# Patient Record
Sex: Female | Born: 1950 | Race: Black or African American | Hispanic: No | Smoking: Former smoker
Health system: Southern US, Community
[De-identification: ages and names within clinical notes are randomized; demographics above are authoritative.]

## PROBLEM LIST (undated history)

## (undated) DIAGNOSIS — N200 Calculus of kidney: Secondary | ICD-10-CM

## (undated) DIAGNOSIS — C801 Malignant (primary) neoplasm, unspecified: Secondary | ICD-10-CM

## (undated) DIAGNOSIS — Z87442 Personal history of urinary calculi: Secondary | ICD-10-CM

## (undated) DIAGNOSIS — E119 Type 2 diabetes mellitus without complications: Secondary | ICD-10-CM

## (undated) HISTORY — PX: EYE SURGERY: SHX253

## (undated) HISTORY — DX: Type 2 diabetes mellitus without complications: E11.9

## (undated) HISTORY — PX: CATARACT EXTRACTION: SUR2

## (undated) HISTORY — PX: ABDOMINAL HYSTERECTOMY: SHX81

## (undated) HISTORY — PX: OTHER SURGICAL HISTORY: SHX169

## (undated) HISTORY — PX: BREAST SURGERY: SHX581

## (undated) HISTORY — PX: US KOMAN RT BREAST (ARMC HX): HXRAD1174

## (undated) HISTORY — DX: Calculus of kidney: N20.0

---

## 2009-07-31 DIAGNOSIS — C7A092 Malignant carcinoid tumor of the stomach: Secondary | ICD-10-CM | POA: Insufficient documentation

## 2016-05-02 DIAGNOSIS — N200 Calculus of kidney: Secondary | ICD-10-CM | POA: Insufficient documentation

## 2016-05-02 DIAGNOSIS — K219 Gastro-esophageal reflux disease without esophagitis: Secondary | ICD-10-CM | POA: Insufficient documentation

## 2016-05-02 DIAGNOSIS — E785 Hyperlipidemia, unspecified: Secondary | ICD-10-CM | POA: Insufficient documentation

## 2016-05-02 DIAGNOSIS — C50919 Malignant neoplasm of unspecified site of unspecified female breast: Secondary | ICD-10-CM | POA: Insufficient documentation

## 2016-07-03 DIAGNOSIS — Z853 Personal history of malignant neoplasm of breast: Secondary | ICD-10-CM | POA: Insufficient documentation

## 2017-02-03 DIAGNOSIS — Z87898 Personal history of other specified conditions: Secondary | ICD-10-CM | POA: Insufficient documentation

## 2017-03-30 DIAGNOSIS — Z9010 Acquired absence of unspecified breast and nipple: Secondary | ICD-10-CM | POA: Insufficient documentation

## 2017-04-13 ENCOUNTER — Emergency Department
Admission: EM | Admit: 2017-04-13 | Discharge: 2017-04-13 | Disposition: A | Discharge status: Home / Self Care | Payer: Medicare Other | Attending: Emergency Medicine | Admitting: Emergency Medicine

## 2017-04-13 DIAGNOSIS — L253 Unspecified contact dermatitis due to other chemical products: Secondary | ICD-10-CM | POA: Insufficient documentation

## 2017-04-13 DIAGNOSIS — R21 Rash and other nonspecific skin eruption: Secondary | ICD-10-CM | POA: Diagnosis present

## 2017-04-13 MED ORDER — TRIAMCINOLONE ACETONIDE 0.5 % EX OINT: 1.0000 "application " | TOPICAL_OINTMENT | Freq: Two times a day (BID) | CUTANEOUS | 0 refills | Status: DC

## 2017-04-13 NOTE — ED Notes (Signed)
First Nurse Note: Pt ambulatory c/o allergic reaction. Pt in NAD.

## 2017-04-13 NOTE — ED Provider Notes (Signed)
[facility] Emergency Department Provider Note  ____________________________________________   I have reviewed the triage vital signs and the nursing notes.   HISTORY  Chief Complaint Rash    HPI Marcia Blake is a 66 y.o. female  Who presents today complaining of a rash.  Patient did have surgery, reconstructive, on her chest and she had incisions on her back in her chest wall.  She has been putting a cocoa butter cream preparation on this area for the last several weeks and as she has been doing that she has had a progression of rash exactly where she has been placing the cream.  She was seen by her surgeon a few days ago and told to stop using the cream which she did on Friday but the rash persists.  She is on steroids as a result of this as well.  She has been taking steroids for 2 days.  There is been no fever no chills no nausea no vomiting no anaphylactic symptoms no throat swelling no hives etc.  The rash is a burning discomfort in the exact area of where she was putting her cream.  She has not had a localized reaction like this before.  She denies any systemic symptoms.  She has had no recent radiation oncology therapy    No past medical history on file.  There are no active problems to display for this patient.     Prior to Admission medications   Not on File    Allergies Penicillins and Sulfur  No family history on file.  Social History Social History   Tobacco Use  . Smoking status: Not on file  Substance Use Topics  . Alcohol use: Not on file  . Drug use: Not on file    Review of Systems Constitutional: No fever/chills Eyes: No visual changes. ENT: No sore throat. No stiff neck no neck pain Cardiovascular: Denies chest pain. Respiratory: Denies shortness of breath. Gastrointestinal:   no vomiting.  No diarrhea.  No constipation. Genitourinary: Negative for dysuria. Musculoskeletal: Negative lower extremity swelling Skin:  Positive for rash. Neurological: Negative for severe headaches, focal weakness or numbness.   ____________________________________________   PHYSICAL EXAM:  VITAL SIGNS: ED Triage Vitals  Enc Vitals Group     BP 04/13/17 0754 136/68     Pulse Rate 04/13/17 0754 85     Resp 04/13/17 0754 18     Temp 04/13/17 0754 98 F (36.7 C)     Temp Source 04/13/17 0754 Oral     SpO2 04/13/17 0754 98 %     Weight 04/13/17 0754 182 lb (82.6 kg)     Height 04/13/17 0754 5\' 2"  (1.575 m)     Head Circumference --      Peak Flow --      Pain Score 04/13/17 0808 8     Pain Loc --      Pain Edu? --      Excl. in [place]? --     Constitutional: Alert and oriented. Well appearing and in no acute distress. Eyes: Conjunctivae are normal Head: Atraumatic HEENT: No congestion/rhinnorhea. Mucous membranes are moist.  Oropharynx non-erythematous Neck:   Nontender with no meningismus, no masses, no stridor Cardiovascular: Normal rate, regular rhythm. Grossly normal heart sounds.  Good peripheral circulation. Respiratory: Normal respiratory effort.  No retractions. Lungs CTAB. Abdominal: Soft and nontender. No distention. No guarding no rebound Back:  There is no focal tenderness or step off.  there is no midline tenderness  there are no lesions noted. there is no CVA tenderness Musculoskeletal: No lower extremity tenderness, no upper extremity tenderness. No joint effusions, no DVT signs strong distal pulses no edema Neurologic:  Normal speech and language. No gross focal neurologic deficits are appreciated.  Skin:  Skin is warm, dry and intact.  There is linear, rectangular rash over the scar tissue on her back bilaterally and exact distribution of where she has been placed in the cream and also along the suture layers in front on the chest wall.  Female nurse chaperone present.  Blanchable, non-tender, no satellite lesions, not hot to touch. Psychiatric: Mood and affect are normal. Speech and behavior are  normal.  ____________________________________________   LABS (all labs ordered are listed, but only abnormal results are displayed)  Labs Reviewed - No data to display  Pertinent labs  results that were available during my care of the patient were reviewed by me and considered in my medical decision making (see chart for details). ____________________________________________  EKG  I personally interpreted any EKGs ordered by me or triage  ____________________________________________  RADIOLOGY  Pertinent labs & imaging results that were available during my care of the patient were reviewed by me and considered in my medical decision making (see chart for details). If possible, patient and/or family made aware of any abnormal findings.  No results found. ____________________________________________    PROCEDURES  Procedure(s) performed: None  Procedures  Critical Care performed: None  ____________________________________________   INITIAL IMPRESSION / ASSESSMENT AND PLAN / ED COURSE  Pertinent labs & imaging results that were available during my care of the patient were reviewed by me and considered in my medical decision making (see chart for details).  She is here with symptoms consistent with a contact dermatitis exactly in the distribution of where she has been using cocoa butter cream.  We have advised her to stop using this.  Patient actually has not put it on there for a day and a half.  We will start her on topical steroids, I do not see any evidence of cellulitis or systemic anaphylaxis.  Patient very comfortable with this plan return precautions and follow-up given and understood.    ____________________________________________   FINAL CLINICAL IMPRESSION(S) / ED DIAGNOSES  Final diagnoses:  None      This chart was dictated using voice recognition software.  Despite best efforts to proofread,  errors can occur which can change meaning.       Schuyler Amor, MD 04/13/17 [phone]

## 2017-04-13 NOTE — ED Notes (Signed)
ED Provider at bedside. 

## 2017-04-13 NOTE — ED Triage Notes (Signed)
Pt presents via POV c/o rash to back and chest, possible allergic reaction per pt report. Pt currently taking prednisone outpatient for rash. Recent bilateral mastectomy. Pt denies fever. Seen Thursday by plastics.

## 2019-01-20 ENCOUNTER — Other Ambulatory Visit: Payer: Self-pay

## 2019-01-20 DIAGNOSIS — Z800 Family history of malignant neoplasm of digestive organs: Secondary | ICD-10-CM

## 2019-01-20 DIAGNOSIS — Z1211 Encounter for screening for malignant neoplasm of colon: Secondary | ICD-10-CM

## 2019-01-20 DIAGNOSIS — Z860100 Personal history of colon polyps, unspecified: Secondary | ICD-10-CM

## 2019-01-20 MED ORDER — PEG 3350-KCL-NA BICARB-NACL 420 G PO SOLR: 4000.0000 mL | Freq: Once | ORAL | 0 refills | Status: AC

## 2019-01-20 MED ORDER — NA SULFATE-K SULFATE-MG SULF 17.5-3.13-1.6 GM/177ML PO SOLN: 1.0000 | Freq: Once | ORAL | 0 refills | Status: DC

## 2019-02-01 ENCOUNTER — Other Ambulatory Visit: Payer: Self-pay

## 2019-02-01 ENCOUNTER — Other Ambulatory Visit
Admission: RE | Admit: 2019-02-01 | Discharge: 2019-02-01 | Disposition: A | Discharge status: Home / Self Care | Payer: Medicare Other | Source: Ambulatory Visit | Attending: Gastroenterology | Admitting: Gastroenterology

## 2019-02-01 DIAGNOSIS — Z20828 Contact with and (suspected) exposure to other viral communicable diseases: Secondary | ICD-10-CM | POA: Diagnosis not present

## 2019-02-01 DIAGNOSIS — K635 Polyp of colon: Secondary | ICD-10-CM | POA: Diagnosis not present

## 2019-02-01 DIAGNOSIS — K5790 Diverticulosis of intestine, part unspecified, without perforation or abscess without bleeding: Secondary | ICD-10-CM | POA: Diagnosis not present

## 2019-02-01 DIAGNOSIS — K649 Unspecified hemorrhoids: Secondary | ICD-10-CM | POA: Diagnosis not present

## 2019-02-01 DIAGNOSIS — Z01812 Encounter for preprocedural laboratory examination: Secondary | ICD-10-CM | POA: Insufficient documentation

## 2019-02-01 LAB — SARS CORONAVIRUS 2 (TAT 6-24 HRS): SARS Coronavirus 2: NEGATIVE

## 2019-02-03 ENCOUNTER — Encounter: Payer: Self-pay | Admitting: *Deleted

## 2019-02-04 ENCOUNTER — Ambulatory Visit: Payer: Medicare Other | Admitting: Certified Registered"

## 2019-02-04 ENCOUNTER — Ambulatory Visit
Admission: RE | Admit: 2019-02-04 | Discharge: 2019-02-04 | Disposition: A | Discharge status: Home / Self Care | Payer: Medicare Other | Attending: Gastroenterology | Admitting: Gastroenterology

## 2019-02-04 ENCOUNTER — Encounter: Payer: Self-pay | Admitting: *Deleted

## 2019-02-04 ENCOUNTER — Encounter
Admission: RE | Disposition: A | Discharge status: Home / Self Care | Payer: Self-pay | Source: Home / Self Care | Attending: Gastroenterology

## 2019-02-04 DIAGNOSIS — Z1211 Encounter for screening for malignant neoplasm of colon: Secondary | ICD-10-CM | POA: Diagnosis not present

## 2019-02-04 DIAGNOSIS — Z8371 Family history of colonic polyps: Secondary | ICD-10-CM | POA: Insufficient documentation

## 2019-02-04 DIAGNOSIS — K621 Rectal polyp: Secondary | ICD-10-CM

## 2019-02-04 DIAGNOSIS — Z87891 Personal history of nicotine dependence: Secondary | ICD-10-CM | POA: Insufficient documentation

## 2019-02-04 DIAGNOSIS — D122 Benign neoplasm of ascending colon: Secondary | ICD-10-CM | POA: Diagnosis not present

## 2019-02-04 DIAGNOSIS — D123 Benign neoplasm of transverse colon: Secondary | ICD-10-CM | POA: Insufficient documentation

## 2019-02-04 DIAGNOSIS — K635 Polyp of colon: Secondary | ICD-10-CM

## 2019-02-04 DIAGNOSIS — K5730 Diverticulosis of large intestine without perforation or abscess without bleeding: Secondary | ICD-10-CM

## 2019-02-04 DIAGNOSIS — K644 Residual hemorrhoidal skin tags: Secondary | ICD-10-CM

## 2019-02-04 DIAGNOSIS — Z86010 Personal history of colonic polyps: Secondary | ICD-10-CM

## 2019-02-04 DIAGNOSIS — Z800 Family history of malignant neoplasm of digestive organs: Secondary | ICD-10-CM

## 2019-02-04 DIAGNOSIS — Z853 Personal history of malignant neoplasm of breast: Secondary | ICD-10-CM | POA: Insufficient documentation

## 2019-02-04 HISTORY — PX: COLONOSCOPY WITH PROPOFOL: SHX5780

## 2019-02-04 HISTORY — DX: Malignant (primary) neoplasm, unspecified: C80.1

## 2019-02-04 SURGERY — COLONOSCOPY WITH PROPOFOL
Anesthesia: General

## 2019-02-04 MED ORDER — PHENYLEPHRINE HCL (PRESSORS) 10 MG/ML IV SOLN
INTRAVENOUS | Status: AC
  Filled 2019-02-04: qty 1

## 2019-02-04 MED ORDER — LIDOCAINE HCL (PF) 2 % IJ SOLN
INTRAMUSCULAR | Status: AC
  Filled 2019-02-04: qty 10

## 2019-02-04 MED ORDER — SODIUM CHLORIDE 0.9 % IV SOLN
INTRAVENOUS | Status: DC
  Administered 2019-02-04: 1000 mL via INTRAVENOUS

## 2019-02-04 MED ORDER — PROPOFOL 500 MG/50ML IV EMUL
INTRAVENOUS | Status: AC
  Filled 2019-02-04: qty 50

## 2019-02-04 MED ORDER — PROPOFOL 10 MG/ML IV BOLUS
INTRAVENOUS | Status: DC | PRN
  Administered 2019-02-04: 50 mg via INTRAVENOUS
  Administered 2019-02-04 (×2): 20 mg via INTRAVENOUS

## 2019-02-04 MED ORDER — PROPOFOL 500 MG/50ML IV EMUL
INTRAVENOUS | Status: DC | PRN
  Administered 2019-02-04: 100 ug/kg/min via INTRAVENOUS

## 2019-02-04 MED ORDER — LIDOCAINE HCL (CARDIAC) PF 100 MG/5ML IV SOSY
PREFILLED_SYRINGE | INTRAVENOUS | Status: DC | PRN
  Administered 2019-02-04: 60 mg via INTRAVENOUS

## 2019-02-04 NOTE — Anesthesia Preprocedure Evaluation (Addendum)
Anesthesia Evaluation  Patient identified by MRN, date of birth, ID band Patient awake    Reviewed: Allergy & Precautions, H&P , NPO status , Patient's Chart, lab work & pertinent test results  Airway Mallampati: II  TM Distance: >3 FB     Dental  (+) Teeth Intact   Pulmonary neg shortness of breath, neg sleep apnea, neg COPD, neg recent URI, former smoker,           Cardiovascular (-) angina(-) Past MI negative cardio ROS  (-) dysrhythmias      Neuro/Psych negative neurological ROS  negative psych ROS   GI/Hepatic negative GI ROS, Neg liver ROS,   Endo/Other  negative endocrine ROS  Renal/GU negative Renal ROS  negative genitourinary   Musculoskeletal   Abdominal   Peds  Hematology negative hematology ROS (+)   Anesthesia Other Findings Past Medical History: [id number]: 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  Past Surgical History: No date: bilateral mastectomies; Bilateral No date: BREAST SURGERY No date: [place] KOMAN RT BREAST ([place] HX); Right  BMI    Body Mass Index: 32.76 kg/m      Reproductive/Obstetrics negative OB ROS                            Anesthesia Physical Anesthesia Plan  ASA: II  Anesthesia Plan: General   Post-op Pain Management:    Induction:   PONV Risk Score and Plan: Propofol infusion and TIVA  Airway Management Planned: Natural Airway and Nasal Cannula  Additional Equipment:   Intra-op Plan:   Post-operative Plan:   Informed Consent: I have reviewed the patients History and Physical, chart, labs and discussed the procedure including the risks, benefits and alternatives for the proposed anesthesia with the patient or authorized representative who has indicated his/her understanding and acceptance.     Dental Advisory Given  Plan Discussed with: Anesthesiologist and CRNA  Anesthesia Plan Comments:         Anesthesia Quick Evaluation

## 2019-02-04 NOTE — Op Note (Signed)
[facility] Gastroenterology Patient Name: Marcia Blake Procedure Date: 02/04/2019 8:26 AM MRN: [id number] Account #: [id number] Date of Birth: [birth year] Admit Type: Outpatient Age: 68 Room: [facility] ENDO ROOM 2 Gender: Female Note Status: Finalized Procedure:            Colonoscopy Indications:          Screening for colorectal malignant neoplasm Providers:            Lin Landsman MD, MD Referring MD:         Lin Landsman, Md Medicines:            Monitored Anesthesia Care Complications:        No immediate complications. Estimated blood loss: None. Procedure:            Pre-Anesthesia Assessment:                       - Prior to the procedure, a History and Physical was                        performed, and patient medications and allergies were                        reviewed. The patient is competent. The risks and                        benefits of the procedure and the sedation options and                        risks were discussed with the patient. All questions                        were answered and informed consent was obtained.                        Patient identification and proposed procedure were                        verified by the physician, the nurse, the                        anesthesiologist, the anesthetist and the technician in                        the pre-procedure area in the procedure room in the                        endoscopy suite. Mental Status Examination: alert and                        oriented. Airway Examination: normal oropharyngeal                        airway and neck mobility. Respiratory Examination:                        clear to auscultation. CV Examination: normal.                        Prophylactic Antibiotics: The patient does not require  prophylactic antibiotics. Prior Anticoagulants: The                        patient has taken no previous anticoagulant or   antiplatelet agents. ASA Grade Assessment: III - A                        patient with severe systemic disease. After reviewing                        the risks and benefits, the patient was deemed in                        satisfactory condition to undergo the procedure. The                        anesthesia plan was to use monitored anesthesia care                        (MAC). Immediately prior to administration of                        medications, the patient was re-assessed for adequacy                        to receive sedatives. The heart rate, respiratory rate,                        oxygen saturations, blood pressure, adequacy of                        pulmonary ventilation, and response to care were                        monitored throughout the procedure. The physical status                        of the patient was re-assessed after the procedure.                       After obtaining informed consent, the colonoscope was                        passed under direct vision. Throughout the procedure,                        the patient's blood pressure, pulse, and oxygen                        saturations were monitored continuously. The                        Colonoscope was introduced through the anus and                        advanced to the the cecum, identified by appendiceal                        orifice and ileocecal valve. The colonoscopy was  somewhat difficult due to multiple diverticula in the                        colon. Successful completion of the procedure was aided                        by applying abdominal pressure. The patient tolerated                        the procedure well. The quality of the bowel                        preparation was evaluated using the BBPS [facility] Bowel                        Preparation Scale) with scores of: Right Colon = 3,                        Transverse Colon = 3 and Left Colon = 3 (entire mucosa                         seen well with no residual staining, small fragments of                        stool or opaque liquid). The total BBPS score equals 9. Findings:      The perianal and digital rectal examinations were normal. Pertinent       negatives include normal sphincter tone and no palpable rectal lesions.      Two sessile polyps were found in the transverse colon and ascending       colon. The polyps were 3 mm in size. These polyps were removed with a       cold snare. Resection and retrieval were complete.      Two sessile polyps were found in the rectum. The polyps were diminutive       in size. These polyps were removed with a cold biopsy forceps. Resection       and retrieval were complete.      Scattered diverticula were found in the entire colon. There was no       evidence of diverticular bleeding.      Non-bleeding external hemorrhoids were found during retroflexion. The       hemorrhoids were large. Impression:           - Two 3 mm polyps in the transverse colon and in the                        ascending colon, removed with a cold snare. Resected                        and retrieved.                       - Two diminutive polyps in the rectum, removed with a                        cold biopsy forceps. Resected and retrieved.                       - Severe  diverticulosis in the entire examined colon.                        There was no evidence of diverticular bleeding.                       - Non-bleeding external hemorrhoids. Recommendation:       - Discharge patient to home (with escort).                       - Resume previous diet today.                       - Continue present medications.                       - Await pathology results.                       - Repeat colonoscopy in 7-10 years for surveillance                        based on pathology results. Procedure Code(s):    --- Professional ---                       [phone], Colonoscopy, flexible; with removal  of tumor(s),                        polyp(s), or other lesion(s) by snare technique                       45380, 31, Colonoscopy, flexible; with biopsy, single                        or multiple Diagnosis Code(s):    --- Professional ---                       Z12.11, Encounter for screening for malignant neoplasm                        of colon                       K63.5, Polyp of colon                       K64.4, Residual hemorrhoidal skin tags                       K62.1, Rectal polyp                       K57.30, Diverticulosis of large intestine without                        perforation or abscess without bleeding CPT copyright 2019 American Medical Association. All rights reserved. The codes documented in this report are preliminary and upon coder review may  be revised to meet current compliance requirements. Dr. Ulyess Mort Lin Landsman MD, MD 02/04/2019 9:18:18 AM This report has been signed electronically. Number of Addenda: 0 Note Initiated On: 02/04/2019 8:26 AM Scope Withdrawal Time: 0 hours 14 minutes 1 second  Total Procedure Duration:  0 hours 19 minutes 55 seconds  Estimated Blood Loss: Estimated blood loss: none.      [facility]

## 2019-02-04 NOTE — Transfer of Care (Signed)
Immediate Anesthesia Transfer of Care Note  Patient: Marcia Blake  Procedure(s) Performed: COLONOSCOPY WITH PROPOFOL (N/A )  Patient Location: PACU and Endoscopy Unit  Anesthesia Type:General  Level of Consciousness: sedated  Airway & Oxygen Therapy: Patient Spontanous Breathing  Post-op Assessment: Report given to RN and Post -op Vital signs reviewed and stable  Post vital signs: Reviewed and stable  Last Vitals:  Vitals Value Taken Time  BP 128/59 02/04/19 0920  Temp 36.5 C 02/04/19 0920  Pulse 82 02/04/19 0920  Resp 12 02/04/19 0920  SpO2 100 % 02/04/19 0920  Vitals shown include unvalidated device data.  Last Pain:  Vitals:   02/04/19 0920  TempSrc: Tympanic  PainSc:          Complications: No apparent anesthesia complications

## 2019-02-04 NOTE — Anesthesia Post-op Follow-up Note (Signed)
Anesthesia QCDR form completed.        

## 2019-02-04 NOTE — H&P (Signed)
Cephas Darby, MD [address]  [place]  [place], [place] [address]  Main: [phone]  Fax: [phone] Pager: [phone]  Primary Care Physician:  Lin Landsman, MD Primary Gastroenterologist:  Dr. Cephas Darby  Pre-Procedure History & Physical: HPI:  Marcia Blake is a 68 y.o. female is here for an colonoscopy.   Past Medical History:  Diagnosis Date  . Cancer ([place]) [id number]   breast-rt.    Past Surgical History:  Procedure Laterality Date  . bilateral mastectomies Bilateral   . BREAST SURGERY    . [place] KOMAN RT BREAST ([place] HX) Right     Prior to Admission medications   Medication Sig Start Date End Date Taking? Authorizing Provider  ascorbic acid (VITAMIN C) 1000 MG tablet Take 1,000 mg by mouth daily.   Yes [provider]  atorvastatin (LIPITOR) 20 MG tablet Take 20 mg by mouth daily.   Yes [provider]  Biotin 5000 MCG TABS Take 5,000 mg by mouth.   Yes [provider]  calcium-vitamin D (OSCAL WITH D) 500-200 MG-UNIT tablet Take 1 tablet by mouth.   Yes [provider]  co-enzyme Q-10 50 MG capsule Take 100 mg by mouth daily.   Yes [provider]  multivitamin-iron-minerals-folic acid (CENTRUM) chewable tablet Chew 1 tablet by mouth daily.   Yes [provider]  triamcinolone ointment (KENALOG) 0.5 % Apply 1 application topically 2 (two) times daily. 04/13/17  Yes Schuyler Amor, MD  ranitidine (ZANTAC) 150 MG capsule Take 150 mg by mouth 2 (two) times daily.    [provider]    Allergies as of 01/20/2019 - Review Complete 04/13/2017  Allergen Reaction Noted  . Penicillins Rash 04/13/2017  . Sulfur Rash 04/13/2017    History reviewed. No pertinent family history.  Social History   Socioeconomic History  . Marital status: Divorced    Spouse name: Not on file  . Number of children: Not on file  . Years of education: Not on file  . Highest education level: Not on file   Occupational History  . Not on file  Social Needs  . Financial resource strain: Not on file  . Food insecurity    Worry: Not on file    Inability: Not on file  . Transportation needs    Medical: Not on file    Non-medical: Not on file  Tobacco Use  . Smoking status: Former Smoker    Types: Cigarettes    Quit date: 1970    Years since quitting: 50.7  . Smokeless tobacco: Never Used  Substance and Sexual Activity  . Alcohol use: Yes    Comment: sociatly  . Drug use: Never  . Sexual activity: Not on file  Lifestyle  . Physical activity    Days per week: Not on file    Minutes per session: Not on file  . Stress: Not on file  Relationships  . Social Herbalist on phone: Not on file    Gets together: Not on file    Attends religious service: Not on file    Active member of club or organization: Not on file    Attends meetings of clubs or organizations: Not on file    Relationship status: Not on file  . Intimate partner violence    Fear of current or ex partner: Not on file    Emotionally abused: Not on file    Physically abused: Not on file    Forced  sexual activity: Not on file  Other Topics Concern  . Not on file  Social History Narrative  . Not on file    Review of Systems: See HPI, otherwise negative ROS  Physical Exam: BP (!) 148/76   Pulse 94   Temp (!) 96.9 F (36.1 C) (Tympanic)   Resp 20   Ht 5' 2.5" (1.588 m)   Wt 82.6 kg   SpO2 100%   BMI 32.76 kg/m  General:   Alert,  pleasant and cooperative in NAD Head:  Normocephalic and atraumatic. Neck:  Supple; no masses or thyromegaly. Lungs:  Clear throughout to auscultation.    Heart:  Regular rate and rhythm. Abdomen:  Soft, nontender and nondistended. Normal bowel sounds, without guarding, and without rebound.   Neurologic:  Alert and  oriented x4;  grossly normal neurologically.  Impression/Plan: Marcia Blake is here for an colonoscopy to be performed for colon cancer screening   Risks, benefits, limitations, and alternatives regarding  colonoscopy have been reviewed with the patient.  Questions have been answered.  All parties agreeable.   Sherri Sear, MD  02/04/2019, 8:39 AM

## 2019-02-05 LAB — SURGICAL PATHOLOGY

## 2019-02-05 NOTE — Anesthesia Postprocedure Evaluation (Signed)
Anesthesia Post Note  Patient: Marcia Blake  Procedure(s) Performed: COLONOSCOPY WITH PROPOFOL (N/A )  Patient location during evaluation: PACU Anesthesia Type: General Level of consciousness: awake and alert Pain management: pain level controlled Vital Signs Assessment: post-procedure vital signs reviewed and stable Respiratory status: spontaneous breathing, nonlabored ventilation and respiratory function stable Cardiovascular status: blood pressure returned to baseline and stable Postop Assessment: no apparent nausea or vomiting Anesthetic complications: no     Last Vitals:  Vitals:   02/04/19 0930 02/04/19 0940  BP: 139/79 (!) 150/79  Pulse: 77 79  Resp: 18 15  Temp:    SpO2: 100% 100%    Last Pain:  Vitals:   02/05/19 0738  TempSrc:   PainSc: 0-No pain                 Durenda Hurt

## 2019-07-15 ENCOUNTER — Ambulatory Visit: Payer: Medicare Other

## 2019-07-15 ENCOUNTER — Ambulatory Visit: Payer: Medicare Other | Attending: Internal Medicine

## 2019-07-15 DIAGNOSIS — Z23 Encounter for immunization: Secondary | ICD-10-CM | POA: Insufficient documentation

## 2019-07-15 NOTE — Progress Notes (Signed)
   Covid-19 Vaccination Clinic  Name:  Intisar Landin    MRN: [id number] DOB: 06/03/1950  07/15/2019  Ms. Moree was observed post Covid-19 immunization for 30 minutes based on pre-vaccination screening without incident. She was provided with Vaccine Information Sheet and instruction to access the V-Safe system.   Ms. Doelling was instructed to call 911 with any severe reactions post vaccine: Marland Kitchen Difficulty breathing  . Swelling of face and throat  . A fast heartbeat  . A bad rash all over body  . Dizziness and weakness   Immunizations Administered    Name Date Dose VIS Date Route   Pfizer COVID-19 Vaccine 07/15/2019 10:12 AM 0.3 mL 04/23/2019 Intramuscular   Manufacturer: [place]   Lot: [id number]   [place]: [id number]

## 2019-08-05 ENCOUNTER — Ambulatory Visit: Payer: Medicare Other | Attending: Internal Medicine

## 2019-08-05 DIAGNOSIS — Z23 Encounter for immunization: Secondary | ICD-10-CM

## 2019-08-05 NOTE — Progress Notes (Signed)
   Covid-19 Vaccination Clinic  Name:  Marcia Blake    MRN: [id number] DOB: 18-Jun-1950  08/05/2019  Ms. Rubens was observed post Covid-19 immunization for 15 minutes without incident. She was provided with Vaccine Information Sheet and instruction to access the V-Safe system.   Ms. Mercy was instructed to call 911 with any severe reactions post vaccine: Marland Kitchen Difficulty breathing  . Swelling of face and throat  . A fast heartbeat  . A bad rash all over body  . Dizziness and weakness   Immunizations Administered    Name Date Dose VIS Date Route   Pfizer COVID-19 Vaccine 08/05/2019  9:59 AM 0.3 mL 04/23/2019 Intramuscular   Manufacturer: [employer], [employer]   Lot: [id number]   [place]: [id number]

## 2019-08-25 ENCOUNTER — Other Ambulatory Visit: Payer: Self-pay

## 2019-08-25 ENCOUNTER — Ambulatory Visit: Payer: Medicare Other | Admitting: Dermatology

## 2019-08-25 DIAGNOSIS — L821 Other seborrheic keratosis: Secondary | ICD-10-CM

## 2019-08-25 DIAGNOSIS — L578 Other skin changes due to chronic exposure to nonionizing radiation: Secondary | ICD-10-CM

## 2019-08-25 DIAGNOSIS — D239 Other benign neoplasm of skin, unspecified: Secondary | ICD-10-CM

## 2019-08-25 DIAGNOSIS — D2362 Other benign neoplasm of skin of left upper limb, including shoulder: Secondary | ICD-10-CM

## 2019-08-25 DIAGNOSIS — Z1283 Encounter for screening for malignant neoplasm of skin: Secondary | ICD-10-CM | POA: Diagnosis not present

## 2019-08-25 DIAGNOSIS — L649 Androgenic alopecia, unspecified: Secondary | ICD-10-CM

## 2019-08-25 DIAGNOSIS — D2272 Melanocytic nevi of left lower limb, including hip: Secondary | ICD-10-CM

## 2019-08-25 DIAGNOSIS — B078 Other viral warts: Secondary | ICD-10-CM

## 2019-08-25 DIAGNOSIS — L219 Seborrheic dermatitis, unspecified: Secondary | ICD-10-CM

## 2019-08-25 DIAGNOSIS — D2372 Other benign neoplasm of skin of left lower limb, including hip: Secondary | ICD-10-CM

## 2019-08-25 DIAGNOSIS — D229 Melanocytic nevi, unspecified: Secondary | ICD-10-CM

## 2019-08-25 DIAGNOSIS — I8310 Varicose veins of unspecified lower extremity with inflammation: Secondary | ICD-10-CM

## 2019-08-25 MED ORDER — FLUOCINONIDE 0.05 % EX SOLN: CUTANEOUS | 3 refills | Status: DC

## 2019-08-25 MED ORDER — KETOCONAZOLE 2 % EX SHAM: MEDICATED_SHAMPOO | CUTANEOUS | 11 refills | Status: DC

## 2019-08-25 NOTE — Progress Notes (Signed)
   Follow-Up Visit   Subjective  Marcia Blake is a 69 y.o. female who presents for the following: Annual Exam.  Spot on left neck that patient noticed over 1 year ago. May be getting larger and gets in the way. Spot near right eye that has been present for years but would like to get it checked. Non-symptomatic.  Patient was diagnosed with scarring alopecia years ago. Would like to be evaluated today.   The following portions of the chart were reviewed this encounter and updated as appropriate: Tobacco  Allergies  Meds  Problems  Med Hx  Surg Hx  Fam Hx      Review of Systems: No other skin or systemic complaints.  Objective  Well appearing patient in no apparent distress; mood and affect are within normal limits.  A full examination was performed including scalp, head, eyes, ears, nose, lips, neck, chest, axillae, abdomen, back, buttocks, bilateral upper extremities, bilateral lower extremities, hands, feet, fingers, toes, fingernails, and toenails. All findings within normal limits unless otherwise noted below.  Objective  Scalp: Mild thinning, age related.  Objective  Left calf, left post shoulder: Firm pink/brown papulenodule with dimple sign.   Objective  Left shin: 1.0 x 0.6cm brown thin papule  Objective  Left Neck: Verrucous papule  Objective  Scalp, ears: Pink patches with greasy scale.   Assessment & Plan    Skin cancer screening performed today.  Seborrheic Keratoses - Stuck-on, waxy, tan-brown papules and plaques  - Discussed benign etiology and prognosis. - Observe - Call for any changes  Melanocytic Nevi - Tan-brown and/or pink-flesh-colored symmetric macules and papules - Benign appearing on exam today - Observation - Call clinic for new or changing moles - Recommend daily use of broad spectrum spf 30+ sunscreen to sun-exposed areas.   Varicose Veins - Dilated blue, purple or red veins at the lower extremities - Reassured - These  can be treated by sclerotherapy (a procedure to inject a medicine into the veins to make them disappear) if desired, but the treatment is not covered by insurance  Actinic Damage - diffuse scaly erythematous macules with underlying dyspigmentation - Recommend daily broad spectrum sunscreen SPF 30+ to sun-exposed areas, reapply every 2 hours as needed.  - Call for new or changing lesions.  Androgenetic alopecia Scalp  Recommend Rogaine   Dermatofibroma Left calf, left post shoulder  Benign, observe.    Nevus Left shin  Benign-appearing.  Observation.  Call clinic for new or changing moles.  Recommend daily use of broad spectrum spf 30+ sunscreen to sun-exposed areas.     Other viral warts Left Neck  Patient defers treatment in office today.  Recommend over the counter salicylic acid qhs   Seborrheic dermatitis Scalp, ears  Start fluocinonide solution daily as needed to scalp and ears.  Start ketoconazole 2% shampoo 1-3x/wk, leave on for 10 minutes before washing out.  fluocinonide (LIDEX) 0.05 % external solution - Scalp, ears  ketoconazole (NIZORAL) 2 % shampoo - Scalp, ears  Return if symptoms worsen or fail to improve.   Graciella Belton, RMA, am acting as scribe for Forest Gleason, MD .  Documentation: I have reviewed the above documentation for accuracy and completeness, and I agree with the above.  Forest Gleason, MD

## 2019-08-25 NOTE — Patient Instructions (Signed)
Recommend daily broad spectrum sunscreen SPF 30+ to sun-exposed areas, reapply every 2 hours as needed. Call for new or changing lesions.  Recommend over the counter salicylic acid (pads, lotion) to area on left neck.

## 2019-09-06 ENCOUNTER — Encounter: Payer: Self-pay | Admitting: Dermatology

## 2019-09-21 ENCOUNTER — Other Ambulatory Visit: Payer: Self-pay | Admitting: Infectious Diseases

## 2019-09-21 DIAGNOSIS — I10 Essential (primary) hypertension: Secondary | ICD-10-CM

## 2019-09-21 DIAGNOSIS — R42 Dizziness and giddiness: Secondary | ICD-10-CM

## 2019-10-01 ENCOUNTER — Other Ambulatory Visit: Payer: Self-pay

## 2019-10-01 ENCOUNTER — Ambulatory Visit
Admission: RE | Admit: 2019-10-01 | Discharge: 2019-10-01 | Disposition: A | Discharge status: Home / Self Care | Payer: Medicare Other | Source: Ambulatory Visit | Attending: Infectious Diseases | Admitting: Infectious Diseases

## 2019-10-01 DIAGNOSIS — R42 Dizziness and giddiness: Secondary | ICD-10-CM | POA: Insufficient documentation

## 2019-10-01 DIAGNOSIS — I10 Essential (primary) hypertension: Secondary | ICD-10-CM | POA: Insufficient documentation

## 2019-10-14 DIAGNOSIS — D3A010 Benign carcinoid tumor of the duodenum: Secondary | ICD-10-CM | POA: Insufficient documentation

## 2019-10-14 DIAGNOSIS — R42 Dizziness and giddiness: Secondary | ICD-10-CM | POA: Insufficient documentation

## 2019-10-14 DIAGNOSIS — M5412 Radiculopathy, cervical region: Secondary | ICD-10-CM | POA: Insufficient documentation

## 2019-10-15 DIAGNOSIS — Z86010 Personal history of colonic polyps: Secondary | ICD-10-CM | POA: Insufficient documentation

## 2019-10-15 DIAGNOSIS — R198 Other specified symptoms and signs involving the digestive system and abdomen: Secondary | ICD-10-CM | POA: Insufficient documentation

## 2019-10-15 DIAGNOSIS — R1013 Epigastric pain: Secondary | ICD-10-CM | POA: Insufficient documentation

## 2019-10-15 DIAGNOSIS — E739 Lactose intolerance, unspecified: Secondary | ICD-10-CM | POA: Insufficient documentation

## 2019-10-15 DIAGNOSIS — Z8619 Personal history of other infectious and parasitic diseases: Secondary | ICD-10-CM | POA: Insufficient documentation

## 2019-11-01 ENCOUNTER — Inpatient Hospital Stay: Payer: Medicare Other | Attending: Oncology | Admitting: Oncology

## 2019-11-01 ENCOUNTER — Inpatient Hospital Stay: Payer: Medicare Other

## 2019-11-01 ENCOUNTER — Other Ambulatory Visit: Payer: Self-pay

## 2019-11-01 ENCOUNTER — Encounter: Payer: Self-pay | Admitting: Oncology

## 2019-11-01 VITALS — BP 126/77 | HR 94 | Temp 98.8°F | Wt 175.0 lb

## 2019-11-01 DIAGNOSIS — Z9013 Acquired absence of bilateral breasts and nipples: Secondary | ICD-10-CM | POA: Diagnosis not present

## 2019-11-01 DIAGNOSIS — Z853 Personal history of malignant neoplasm of breast: Secondary | ICD-10-CM

## 2019-11-01 DIAGNOSIS — Z85020 Personal history of malignant carcinoid tumor of stomach: Secondary | ICD-10-CM | POA: Diagnosis not present

## 2019-11-01 DIAGNOSIS — Z08 Encounter for follow-up examination after completed treatment for malignant neoplasm: Secondary | ICD-10-CM

## 2019-11-01 DIAGNOSIS — C7A00 Malignant carcinoid tumor of unspecified site: Secondary | ICD-10-CM

## 2019-11-01 DIAGNOSIS — Z87891 Personal history of nicotine dependence: Secondary | ICD-10-CM | POA: Diagnosis not present

## 2019-11-01 DIAGNOSIS — Z79899 Other long term (current) drug therapy: Secondary | ICD-10-CM | POA: Insufficient documentation

## 2019-11-01 DIAGNOSIS — Z170 Estrogen receptor positive status [ER+]: Secondary | ICD-10-CM | POA: Insufficient documentation

## 2019-11-01 LAB — COMPREHENSIVE METABOLIC PANEL
ALT: 23 U/L (ref 0–44)
AST: 19 U/L (ref 15–41)
Albumin: 4.1 g/dL (ref 3.5–5.0)
Alkaline Phosphatase: 77 U/L (ref 38–126)
Anion gap: 10 (ref 5–15)
BUN: 17 mg/dL (ref 8–23)
CO2: 30 mmol/L (ref 22–32)
Calcium: 9.8 mg/dL (ref 8.9–10.3)
Chloride: 99 mmol/L (ref 98–111)
Creatinine, Ser: 0.8 mg/dL (ref 0.44–1.00)
GFR calc Af Amer: >60 mL/min (ref >60)
GFR calc non Af Amer: >60 mL/min (ref >60)
Glucose, Bld: 89 mg/dL (ref 70–99)
Potassium: 4.2 mmol/L (ref 3.5–5.1)
Sodium: 139 mmol/L (ref 135–145)
Total Bilirubin: 0.6 mg/dL (ref 0.3–1.2)
Total Protein: 7.7 g/dL (ref 6.5–8.1)

## 2019-11-01 LAB — CBC WITH DIFFERENTIAL/PLATELET
Abs Immature Granulocytes: 0.07 10*3/uL (ref 0.00–0.07)
Basophils Absolute: 0 10*3/uL (ref 0.0–0.1)
Basophils Relative: 0 %
Eosinophils Absolute: 0.1 10*3/uL (ref 0.0–0.5)
Eosinophils Relative: 2 %
HCT: 38.9 % (ref 36.0–46.0)
Hemoglobin: 12.7 g/dL (ref 12.0–15.0)
Immature Granulocytes: 1 %
Lymphocytes Relative: 31 %
Lymphs Abs: 2.2 10*3/uL (ref 0.7–4.0)
MCH: 29.6 pg (ref 26.0–34.0)
MCHC: 32.6 g/dL (ref 30.0–36.0)
MCV: 90.7 fL (ref 80.0–100.0)
Monocytes Absolute: 0.6 10*3/uL (ref 0.1–1.0)
Monocytes Relative: 8 %
Neutro Abs: 4.1 10*3/uL (ref 1.7–7.7)
Neutrophils Relative %: 58 %
Platelets: 251 10*3/uL (ref 150–400)
RBC: 4.29 MIL/uL (ref 3.87–5.11)
RDW: 12.3 % (ref 11.5–15.5)
WBC: 7.1 10*3/uL (ref 4.0–10.5)
nRBC: 0 % (ref 0.0–0.2)

## 2019-11-02 LAB — CHROMOGRANIN A: Chromogranin A (ng/mL): 133.4 ng/mL — ABNORMAL HIGH (ref 0.0–101.8)

## 2019-11-04 NOTE — Progress Notes (Signed)
Hematology/Oncology Consult note [facility] Telephone:([phone] [phone]  Patient Care Team: Leonel Ramsay, MD as PCP - General (Infectious Diseases)   Name of the patient: Marcia Blake  [id number]  [birth year]    Reason for referral-new diagnosis of breast cancer   Referring physician-Dr. Ola Spurr  Date of visit: 11/04/19   History of presenting illness- Patient is a 69 year old female with a past medical history significant for stage I right breast cancer back in 1996.  This was followed by a right breast lumpectomy 6 weeks of radiation treatment and 5 years of tamoxifen.  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  She underwent bilateral mastectomy at that time.  Left breast mastectomy showed sclerosing fibroadenoma with calcifications 0.8 cm completely excised.  No evidence of malignancy.  Right breast mastectomy showed no residual lesion pTis NX.  She did not require adjuvant hormone therapy after bilateral mastectomy.  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  She has been getting chromogranin A levels monitored which have been chronically elevated at 123 and 151 in the past.  Colonoscopy was negative and endoscopy report showed mild inflammatory changes at anastomosis which was widely patent and performed back in 2015.  Patient is currently feeling well and denies any complaints at this time  ECOG PS- 1  Pain scale- 0   Review of systems- Review of Systems  Constitutional: Positive for malaise/fatigue. Negative for chills, fever and weight loss.  HENT: Negative for congestion, ear discharge and nosebleeds.   Eyes: Negative for blurred vision.  Respiratory: Negative for cough, hemoptysis, sputum production, shortness of breath and wheezing.   Cardiovascular: Negative for chest  pain, palpitations, orthopnea and claudication.  Gastrointestinal: Negative for abdominal pain, blood in stool, constipation, diarrhea, heartburn, melena, nausea and vomiting.  Genitourinary: Negative for dysuria, flank pain, frequency, hematuria and urgency.  Musculoskeletal: Negative for back pain, joint pain and myalgias.  Skin: Negative for rash.  Neurological: Negative for dizziness, tingling, focal weakness, seizures, weakness and headaches.  Endo/Heme/Allergies: Does not bruise/bleed easily.  Psychiatric/Behavioral: Negative for depression and suicidal ideas. The patient does not have insomnia.     Allergies  Allergen Reactions  . Penicillins Rash  . Sulfur Rash    Patient Active Problem List   Diagnosis Date Noted  . Encounter for screening colonoscopy   . Family history of colon cancer in mother      Past Medical History:  Diagnosis Date  . Cancer ([place]) [id number]   breast-rt.     Past Surgical History:  Procedure Laterality Date  . bilateral mastectomies Bilateral   . BREAST SURGERY    . COLONOSCOPY WITH PROPOFOL N/A 02/04/2019   Procedure: COLONOSCOPY WITH PROPOFOL;  Surgeon: Lin Landsman, MD;  Location: [facility] ENDOSCOPY;  Service: Gastroenterology;  Laterality: N/A;  . [place] KOMAN RT BREAST ([place] HX) Right     Social History   Socioeconomic History  . Marital status: Divorced    Spouse name: Not on file  . Number of children: Not on file  . Years of education: Not on file  . Highest education level: Not on file  Occupational History  . Not on file  Tobacco Use  . Smoking status: Former Smoker    Types: Cigarettes    Quit date: 1970    Years since quitting: 51.5  .  Smokeless tobacco: Never Used  Vaping Use  . Vaping Use: Never used  Substance and Sexual Activity  . Alcohol use: Yes    Comment: sociatly  . Drug use: Never  . Sexual activity: Not on file  Other Topics Concern  . Not on file  Social History Narrative  . Not on file   Social  Determinants of Health   Financial Resource Strain:   . Difficulty of Paying Living Expenses:   Food Insecurity:   . Worried About Charity fundraiser in the Last Year:   . Arboriculturist in the Last Year:   Transportation Needs:   . Film/video editor (Medical):   Marland Kitchen Lack of Transportation (Non-Medical):   Physical Activity:   . Days of Exercise per Week:   . Minutes of Exercise per Session:   Stress:   . Feeling of Stress :   Social Connections:   . Frequency of Communication with Friends and Family:   . Frequency of Social Gatherings with Friends and Family:   . Attends Religious Services:   . Active Member of Clubs or Organizations:   . Attends Archivist Meetings:   Marland Kitchen Marital Status:   Intimate Partner Violence:   . Fear of Current or Ex-Partner:   . Emotionally Abused:   Marland Kitchen Physically Abused:   . Sexually Abused:      No family history on file.   Current Outpatient Medications:  .  ascorbic acid (VITAMIN C) 1000 MG tablet, Take 1,000 mg by mouth daily., Disp: , Rfl:  .  atorvastatin (LIPITOR) 20 MG tablet, Take 20 mg by mouth daily., Disp: , Rfl:  .  Biotin 5000 MCG TABS, Take 5,000 mg by mouth., Disp: , Rfl:  .  calcium-vitamin D (OSCAL WITH D) 500-200 MG-UNIT tablet, Take 1 tablet by mouth., Disp: , Rfl:  .  fluocinonide (LIDEX) 0.05 % external solution, Use daily as needed to affected areas on scalp, ears for dryness., Disp: 180 mL, Rfl: 3 .  ketoconazole (NIZORAL) 2 % shampoo, Massage into scalp and ears as needed 1-3 times a week, leave on for 10 minutes before washing out., Disp: 120 mL, Rfl: 11 .  triamcinolone ointment (KENALOG) 0.5 %, Apply 1 application topically 2 (two) times daily., Disp: 30 g, Rfl: 0 .  co-enzyme Q-10 50 MG capsule, Take 100 mg by mouth daily., Disp: , Rfl:  .  multivitamin-iron-minerals-folic acid (CENTRUM) chewable tablet, Chew 1 tablet by mouth daily., Disp: , Rfl:  .  ranitidine (ZANTAC) 150 MG capsule, Take 150 mg by  mouth 2 (two) times daily. (Patient not taking: Reported on 11/01/2019), Disp: , Rfl:    Physical exam:  Vitals:   11/01/19 1109  BP: 126/77  Pulse: 94  Temp: 98.8 F (37.1 C)  TempSrc: Tympanic  Weight: 175 lb (79.4 kg)   Physical Exam Constitutional:      General: She is not in acute distress. Cardiovascular:     Rate and Rhythm: Normal rate and regular rhythm.     Heart sounds: Normal heart sounds.  Pulmonary:     Effort: Pulmonary effort is normal.     Breath sounds: Normal breath sounds.  Abdominal:     General: Bowel sounds are normal.     Palpations: Abdomen is soft.  Skin:    General: Skin is warm and dry.  Neurological:     Mental Status: She is alert and oriented to person, place, and time.  Patient is s/p bilateral mastectomy without reconstruction.  No evidence of chest wall recurrence.  No palpable bilateral axillary adenopathy.   CMP Latest Ref Rng & Units 11/01/2019  Glucose 70 - 99 mg/dL 89  BUN 8 - 23 mg/dL 17  Creatinine 0.44 - 1.00 mg/dL 0.80  Sodium 135 - 145 mmol/L 139  Potassium 3.5 - 5.1 mmol/L 4.2  Chloride 98 - 111 mmol/L 99  CO2 22 - 32 mmol/L 30  Calcium 8.9 - 10.3 mg/dL 9.8  Total Protein 6.5 - 8.1 g/dL 7.7  Total Bilirubin 0.3 - 1.2 mg/dL 0.6  Alkaline Phos 38 - 126 U/L 77  AST 15 - 41 U/L 19  ALT 0 - 44 U/L 23   CBC Latest Ref Rng & Units 11/01/2019  WBC 4.0 - 10.5 K/uL 7.1  Hemoglobin 12.0 - 15.0 g/dL 12.7  Hematocrit 36 - 46 % 38.9  Platelets 150 - 400 K/uL 251     Assessment and plan- Patient is a 69 y.o. female with prior history of breast cancer and gastric carcinoid here to reestablish care  1.  History of breast cancer: Post breast cancer in 1996 and second DCIS in 2018.  She is now s/p bilateral mastectomy.  Clinically doing well and no concerning signs and symptoms of recurrence.  No role for tumor marker testing.  No need for mammograms after bilateral mastectomy.  She does not require any hormone therapy despite  having a ER positive DCIS as she underwent bilateral mastectomies.  I will see her back in 1 year for routine exam  2.  History of gastric carcinoid in 2011 s/p resection.Since it has been close to 10 years since her diagnosis no surveillance imaging is indicated unless there are any concerning signs and symptoms.  I will monitor chromogranin A levels on a yearly basis.  Check chromogranin A levels today and I will see her back in 1 year with CBC with differential CMP and chromogranin A   Thank you for this kind referral and the opportunity to participate in the care of this patient   Visit Diagnosis 1. H/O malignant carcinoid tumor of stomach   2. Encounter for follow-up surveillance of breast cancer     Dr. Randa Evens, MD, MPH [facility] [phone] 11/04/2019 10:05 AM

## 2019-11-08 ENCOUNTER — Other Ambulatory Visit: Payer: Self-pay

## 2019-11-08 ENCOUNTER — Encounter: Payer: Self-pay | Admitting: *Deleted

## 2019-11-08 ENCOUNTER — Encounter: Payer: Medicare Other | Attending: Infectious Diseases | Admitting: *Deleted

## 2019-11-08 VITALS — BP 110/64 | Wt 176.4 lb

## 2019-11-08 DIAGNOSIS — Z713 Dietary counseling and surveillance: Secondary | ICD-10-CM | POA: Diagnosis not present

## 2019-11-08 DIAGNOSIS — E785 Hyperlipidemia, unspecified: Secondary | ICD-10-CM | POA: Insufficient documentation

## 2019-11-08 DIAGNOSIS — E119 Type 2 diabetes mellitus without complications: Secondary | ICD-10-CM | POA: Insufficient documentation

## 2019-11-08 DIAGNOSIS — I10 Essential (primary) hypertension: Secondary | ICD-10-CM | POA: Insufficient documentation

## 2019-11-08 NOTE — Patient Instructions (Addendum)
Check blood sugars 1-2 x day before breakfast and 2 hrs after one meal 3-4 x week Bring blood sugar records to the next /class  Exercise: Begin walking  for 10-15 minutes 3-4 days a week and gradually increase to 30 minutes 5 x week  Eat 3 meals day,  1-2  snacks a day Space meals 4-6 hours apart Don't skip meals - at least have 1 protein and 1 carbohydrate serving Avoid sugar sweetened drinks (soda, coffee, juices) Limit desserts/sweets  Call back to schedule classes once Physical Therapy is complete

## 2019-11-08 NOTE — Progress Notes (Signed)
Diabetes Self-Management Education  Visit Type: First/Initial  Appt. Start Time: 1335 Appt. End Time: 0272  11/08/2019  Ms. Constance Haw, identified by name and date of birth, is a 69 y.o. female with a diagnosis of Diabetes: Type 2.   ASSESSMENT  Blood pressure 110/64, weight 176 lb 6.4 oz (80 kg). Body mass index is 31.75 kg/m.   Diabetes Self-Management Education - 11/08/19 1858      Visit Information   Visit Type First/Initial      Initial Visit   Diabetes Type Type 2    Are you currently following a meal plan? Yes    What type of meal plan do you follow? "eating less sweets"    Are you taking your medications as prescribed? Yes    Date Diagnosed "2 weeks ago"      Health Coping   How would you rate your overall health? Good      Psychosocial Assessment   Patient Belief/Attitude about Diabetes Other (comment)   "I am ok, I can do things to control it"   Self-care barriers None    Self-management support Doctor's office;Family    Patient Concerns Nutrition/Meal planning;Glycemic Control;Weight Control;Monitoring;Healthy Lifestyle    Special Needs None    Preferred Learning Style Auditory;Visual;Hands on    Learning Readiness Ready    How often do you need to have someone help you when you read instructions, pamphlets, or other written materials from your doctor or pharmacy? 1 - Never    What is the last grade level you completed in school? Bachelors      Pre-Education Assessment   Patient understands the diabetes disease and treatment process. Needs Instruction    Patient understands incorporating nutritional management into lifestyle. Needs Instruction    Patient undertands incorporating physical activity into lifestyle. Needs Instruction    Patient understands using medications safely. Needs Instruction    Patient understands monitoring blood glucose, interpreting and using results Needs Instruction    Patient understands prevention, detection, and treatment of  acute complications. Needs Instruction    Patient understands prevention, detection, and treatment of chronic complications. Needs Instruction    Patient understands how to develop strategies to address psychosocial issues. Needs Instruction    Patient understands how to develop strategies to promote health/change behavior. Needs Instruction      Complications   Last HgB A1C per patient/outside source 7.1 %   10/12/2019   How often do you check your blood sugar? 0 times/day (not testing)   Pt has a meter but hasn't started testing yet. Instructed her on general use of glucometer.   Have you had a dilated eye exam in the past 12 months? Yes    Have you had a dental exam in the past 12 months? Yes    Are you checking your feet? No      Dietary Intake   Breakfast cereal with milk and banana: bacon or sausage and egg; bagel, English muffin    Lunch skips    Snack (afternoon) orange, peach or left-overs    Dinner chicken, beef, occasional pork with potatoes, green peas, corn, green beans, rice, pasta, broccoli, cauliflower, cabbage, cuccumbers, spinach, occasional tomatoes    Snack (evening) cookies, candy    Beverage(s) water, fruit juice, sugar in coffee, regular ginger-ale      Exercise   Exercise Type ADL's      Patient Education   Previous Diabetes Education No    Disease state  Definition of diabetes, type 1 and  2, and the diagnosis of diabetes;Factors that contribute to the development of diabetes    Nutrition management  Role of diet in the treatment of diabetes and the relationship between the three main macronutrients and blood glucose level;Food label reading, portion sizes and measuring food.;Reviewed blood glucose goals for pre and post meals and how to evaluate the patients' food intake on their blood glucose level.    Physical activity and exercise  Role of exercise on diabetes management, blood pressure control and cardiac health.    Monitoring Taught/evaluated SMBG  meter.;Purpose and frequency of SMBG.;Taught/discussed recording of test results and interpretation of SMBG.;Identified appropriate SMBG and/or A1C goals.    Chronic complications Relationship between chronic complications and blood glucose control    Psychosocial adjustment Identified and addressed patients feelings and concerns about diabetes      Individualized Goals (developed by patient)   Reducing Risk Other (comment)   improve blood sugars, prevent diabetes complications, lose weight, lead a healthier lifestyle, become more fit     Outcomes   Expected Outcomes Demonstrated interest in learning. Expect positive outcomes           Individualized Plan for Diabetes Self-Management Training:   Learning Objective:  Patient will have a greater understanding of diabetes self-management. Patient education plan is to attend individual and/or group sessions per assessed needs and concerns.   Plan:   Patient Instructions  Check blood sugars 1-2 x day before breakfast and 2 hrs after one meal 3-4 x week Bring blood sugar records to the next /class Exercise: Begin walking  for 10-15 minutes 3-4 days a week and gradually increase to 30 minutes 5 x week Eat 3 meals day,  1-2  snacks a day Space meals 4-6 hours apart Don't skip meals - at least have 1 protein and 1 carbohydrate serving Avoid sugar sweetened drinks (soda, coffee, juices) Limit desserts/sweets Call back to schedule classes once Physical Therapy is complete  Expected Outcomes:  Demonstrated interest in learning. Expect positive outcomes  Education material provided:  General Meal Planning Guidelines Simple Meal Plan  If problems or questions, patient to contact team via:  . Johny Drilling, RN, CCM, [place] [phone]  Future DSME appointment:  Patient is currently receiving PT on her shoulder and wants to wait until that is complete so she can come to morning classes. She reports PT should end in July.

## 2019-12-20 ENCOUNTER — Telehealth: Payer: Self-pay | Admitting: *Deleted

## 2019-12-20 NOTE — Telephone Encounter (Signed)
Phone call to follow up on scheduling Diabetes classes. Pt reported at her initial appointment that she wanted to complete Physical Therapy before coming to diabetes classes. At her initial appointment she also reported that these would end in July. Left message for her to call back.

## 2019-12-22 ENCOUNTER — Encounter: Payer: Self-pay | Admitting: Oncology

## 2019-12-23 ENCOUNTER — Other Ambulatory Visit: Payer: Self-pay | Admitting: *Deleted

## 2019-12-23 DIAGNOSIS — Z85020 Personal history of malignant carcinoid tumor of stomach: Secondary | ICD-10-CM

## 2020-01-12 ENCOUNTER — Encounter: Payer: Self-pay | Admitting: *Deleted

## 2020-04-25 ENCOUNTER — Inpatient Hospital Stay: Payer: Medicare Other | Attending: Oncology

## 2020-04-25 DIAGNOSIS — Z85030 Personal history of malignant carcinoid tumor of large intestine: Secondary | ICD-10-CM | POA: Insufficient documentation

## 2020-04-25 DIAGNOSIS — Z85020 Personal history of malignant carcinoid tumor of stomach: Secondary | ICD-10-CM

## 2020-04-25 DIAGNOSIS — R768 Other specified abnormal immunological findings in serum: Secondary | ICD-10-CM | POA: Insufficient documentation

## 2020-04-25 DIAGNOSIS — Z08 Encounter for follow-up examination after completed treatment for malignant neoplasm: Secondary | ICD-10-CM

## 2020-04-25 DIAGNOSIS — Z853 Personal history of malignant neoplasm of breast: Secondary | ICD-10-CM | POA: Diagnosis not present

## 2020-04-25 LAB — CBC WITH DIFFERENTIAL/PLATELET
Abs Immature Granulocytes: 0.02 10*3/uL (ref 0.00–0.07)
Basophils Absolute: 0.1 10*3/uL (ref 0.0–0.1)
Basophils Relative: 1 %
Eosinophils Absolute: 0.2 10*3/uL (ref 0.0–0.5)
Eosinophils Relative: 2 %
HCT: 38.3 % (ref 36.0–46.0)
Hemoglobin: 12.3 g/dL (ref 12.0–15.0)
Immature Granulocytes: 0 %
Lymphocytes Relative: 27 %
Lymphs Abs: 2.2 10*3/uL (ref 0.7–4.0)
MCH: 29.3 pg (ref 26.0–34.0)
MCHC: 32.1 g/dL (ref 30.0–36.0)
MCV: 91.2 fL (ref 80.0–100.0)
Monocytes Absolute: 0.6 10*3/uL (ref 0.1–1.0)
Monocytes Relative: 7 %
Neutro Abs: 5.1 10*3/uL (ref 1.7–7.7)
Neutrophils Relative %: 63 %
Platelets: 262 10*3/uL (ref 150–400)
RBC: 4.2 MIL/uL (ref 3.87–5.11)
RDW: 12.1 % (ref 11.5–15.5)
WBC: 8.1 10*3/uL (ref 4.0–10.5)
nRBC: 0 % (ref 0.0–0.2)

## 2020-04-25 LAB — COMPREHENSIVE METABOLIC PANEL
ALT: 17 U/L (ref 0–44)
AST: 21 U/L (ref 15–41)
Albumin: 4.3 g/dL (ref 3.5–5.0)
Alkaline Phosphatase: 73 U/L (ref 38–126)
Anion gap: 11 (ref 5–15)
BUN: 16 mg/dL (ref 8–23)
CO2: 28 mmol/L (ref 22–32)
Calcium: 9.6 mg/dL (ref 8.9–10.3)
Chloride: 100 mmol/L (ref 98–111)
Creatinine, Ser: 0.78 mg/dL (ref 0.44–1.00)
GFR, Estimated: >60 mL/min (ref >60)
Glucose, Bld: 138 mg/dL — ABNORMAL HIGH (ref 70–99)
Potassium: 3.9 mmol/L (ref 3.5–5.1)
Sodium: 139 mmol/L (ref 135–145)
Total Bilirubin: 0.6 mg/dL (ref 0.3–1.2)
Total Protein: 7.6 g/dL (ref 6.5–8.1)

## 2020-04-26 LAB — CHROMOGRANIN A: Chromogranin A (ng/mL): 153.9 ng/mL — ABNORMAL HIGH (ref 0.0–101.8)

## 2020-04-27 ENCOUNTER — Encounter: Payer: Self-pay | Admitting: Oncology

## 2020-04-27 ENCOUNTER — Other Ambulatory Visit: Payer: Self-pay

## 2020-04-27 DIAGNOSIS — Z85020 Personal history of malignant carcinoid tumor of stomach: Secondary | ICD-10-CM

## 2020-05-16 ENCOUNTER — Ambulatory Visit
Admission: RE | Admit: 2020-05-16 | Discharge: 2020-05-16 | Disposition: A | Discharge status: Home / Self Care | Payer: Medicare Other | Source: Ambulatory Visit | Attending: Oncology | Admitting: Oncology

## 2020-05-16 ENCOUNTER — Other Ambulatory Visit: Payer: Self-pay

## 2020-05-16 DIAGNOSIS — Z85020 Personal history of malignant carcinoid tumor of stomach: Secondary | ICD-10-CM

## 2020-05-16 MED ORDER — IOHEXOL 300 MG/ML  SOLN
100.0000 mL | Freq: Once | INTRAMUSCULAR | Status: AC | PRN
  Administered 2020-05-16: 100 mL via INTRAVENOUS

## 2020-08-22 ENCOUNTER — Encounter: Payer: Self-pay | Admitting: Dermatology

## 2020-08-30 ENCOUNTER — Encounter: Payer: Self-pay | Admitting: Urology

## 2020-08-30 ENCOUNTER — Ambulatory Visit: Payer: Medicare Other | Admitting: Urology

## 2020-08-30 ENCOUNTER — Other Ambulatory Visit: Payer: Self-pay

## 2020-08-30 VITALS — BP 138/81 | HR 87 | Ht 62.0 in | Wt 176.0 lb

## 2020-08-30 DIAGNOSIS — N200 Calculus of kidney: Secondary | ICD-10-CM

## 2020-08-30 DIAGNOSIS — N3000 Acute cystitis without hematuria: Secondary | ICD-10-CM | POA: Diagnosis not present

## 2020-08-30 NOTE — Progress Notes (Signed)
   08/30/20 9:10 AM   Marcia Blake 04-Aug-1950 [id number]  CC: Nephrolithiasis, urinary symptoms  HPI: I saw Marcia Blake in urology clinic today for evaluation of nephrolithiasis.  She is a 70 year old female with history of breast cancer as well as neuroendocrine tumor status post bowel resection who reports 3 prior stone episodes.  1 of these required shockwave lithotripsy in 2018 at [facility].  Stones have been calcium oxalate.  She reports about a week of lower abdominal pressure and some intermittent dysuria with some low back pain, and was concerned she may be passing a stone.  She denies any fevers or chills, or gross hematuria.  She was unable to void for urinalysis today.  Most recent imaging with CT abdomen and pelvis on 05/16/2020 shows no hydronephrosis or nephrolithiasis.  PMH: Past Medical History:  Diagnosis Date  . Cancer ([place]) [id number]   breast-rt.  . Diabetes mellitus without complication [facility])     Surgical History: Past Surgical History:  Procedure Laterality Date  . bilateral mastectomies Bilateral   . BREAST SURGERY    . COLONOSCOPY WITH PROPOFOL N/A 02/04/2019   Procedure: COLONOSCOPY WITH PROPOFOL;  Surgeon: Lin Landsman, MD;  Location: [facility] ENDOSCOPY;  Service: Gastroenterology;  Laterality: N/A;  . [place] KOMAN RT BREAST ([place] HX) Right    Family History: Family History  Problem Relation Age of Onset  . Diabetes Sister     Social History:  reports that she quit smoking about 52 years ago. Her smoking use included cigarettes. She has never used smokeless tobacco. She reports current alcohol use. She reports that she does not use drugs.  Physical Exam: BP 138/81   Pulse 87   Ht 5\' 2"  (1.575 m)   Wt 176 lb (79.8 kg)   BMI 32.19 kg/m    Constitutional:  Alert and oriented, No acute distress. Cardiovascular: No clubbing, cyanosis, or edema. Respiratory: Normal respiratory effort, no increased work of breathing. GI: Abdomen is soft, nontender,  nondistended, no abdominal masses GU: No CVA tenderness  Laboratory Data: Reviewed, see HPI  Pertinent Imaging: I have personally viewed and interpreted the CT from January 2022 showing no hydronephrosis or stones  Assessment & Plan:   71 year old female with mild urinary symptoms of pelvic pressure and intermittent dysuria with recent CT from January 2022 showing no hydronephrosis or stones.  She was unable to void for urinalysis today, and will drop off a sample later this afternoon to rule out UTI.  She has a history of calcium oxalate stones.  We discussed at length her high risk for stone disease with her history of bowel resection, and the importance of decreasing oxalate and meals, and consuming a normal amount of calcium. We discussed general stone prevention strategies including adequate hydration with goal of producing 2.5 L of urine daily, increasing citric acid intake, increasing calcium intake during high oxalate meals, minimizing animal protein, and decreasing salt intake. Information about dietary recommendations given today.   Call with urinalysis results-> antibiotics if suspicious RTC 1 year KUB for stone surveillance   Nickolas Madrid, MD 08/30/2020  [place] [address] [place], [place] [address] [phone]

## 2020-08-30 NOTE — Patient Instructions (Addendum)
drop off urine this afternoon will call with results    Textbook of Natural Medicine (5th ed., pp. [id number]). [place], [place]: Elsevier.">  Dietary Guidelines to Help Prevent Kidney Stones Kidney stones are deposits of minerals and salts that form inside your kidneys. Your risk of developing kidney stones may be greater depending on your diet, your lifestyle, the medicines you take, and whether you have certain medical conditions. Most people can lower their chances of developing kidney stones by following the instructions below. Your dietitian may give you more specific instructions depending on your overall health and the type of kidney stones you tend to develop. What are tips for following this plan? Reading food labels  Choose foods with "no salt added" or "low-salt" labels. Limit your salt (sodium) intake to less than 1,500 mg a day.  Choose foods with calcium for each meal and snack. Try to eat about 300 mg of calcium at each meal. Foods that contain 200-500 mg of calcium a serving include: ? 8 oz (237 mL) of milk, calcium-fortifiednon-dairy milk, and calcium-fortifiedfruit juice. Calcium-fortified means that calcium has been added to these drinks. ? 8 oz (237 mL) of kefir, yogurt, and soy yogurt. ? 4 oz (114 g) of tofu. ? 1 oz (28 g) of cheese. ? 1 cup (150 g) of dried figs. ? 1 cup (91 g) of cooked broccoli. ? One 3 oz (85 g) can of sardines or mackerel. Most people need 1,000-1,500 mg of calcium a day. Talk to your dietitian about how much calcium is recommended for you.   Shopping  Buy plenty of fresh fruits and vegetables. Most people do not need to avoid fruits and vegetables, even if these foods contain nutrients that may contribute to kidney stones.  When shopping for convenience foods, choose: ? Whole pieces of fruit. ? Pre-made salads with dressing on the side. ? Low-fat fruit and yogurt smoothies.  Avoid buying frozen meals or prepared deli foods. These can be high  in sodium.  Look for foods with live cultures, such as yogurt and kefir.  Choose high-fiber grains, such as whole-wheat breads, oat bran, and wheat cereals. Cooking  Do not add salt to food when cooking. Place a salt shaker on the table and allow each person to add his or her own salt to taste.  Use vegetable protein, such as beans, textured vegetable protein (TVP), or tofu, instead of meat in pasta, casseroles, and soups. Meal planning  Eat less salt, if told by your dietitian. To do this: ? Avoid eating processed or pre-made food. ? Avoid eating fast food.  Eat less animal protein, including cheese, meat, poultry, or fish, if told by your dietitian. To do this: ? Limit the number of times you have meat, poultry, fish, or cheese each week. Eat a diet free of meat at least 2 days a week. ? Eat only one serving each day of meat, poultry, fish, or seafood. ? When you prepare animal protein, cut pieces into small portion sizes. For most meat and fish, one serving is about the size of the palm of your hand.  Eat at least five servings of fresh fruits and vegetables each day. To do this: ? Keep fruits and vegetables on hand for snacks. ? Eat one piece of fruit or a handful of berries with breakfast. ? Have a salad and fruit at lunch. ? Have two kinds of vegetables at dinner.  Limit foods that are high in a substance called oxalate. These include: ?  Spinach (cooked), rhubarb, beets, sweet potatoes, and Swiss chard. ? Peanuts. ? Potato chips, french fries, and baked potatoes with skin on. ? Nuts and nut products. ? Chocolate.  If you regularly take a diuretic medicine, make sure to eat at least 1 or 2 servings of fruits or vegetables that are high in potassium each day. These include: ? Avocado. ? Banana. ? Orange, prune, carrot, or tomato juice. ? Baked potato. ? Cabbage. ? Beans and split peas. Lifestyle  Drink enough fluid to keep your urine pale yellow. This is the most  important thing you can do. Spread your fluid intake throughout the day.  If you drink alcohol: ? Limit how much you use to:  0-1 drink a day for women who are not pregnant.  0-2 drinks a day for men. ? Be aware of how much alcohol is in your drink. In the [place] one drink equals one 12 oz bottle of beer (355 mL), one 5 oz glass of wine (148 mL), or one 1 oz glass of hard liquor (44 mL).  Lose weight if told by your health care provider. Work with your dietitian to find an eating plan and weight loss strategies that work best for you.   General information  Talk to your health care provider and dietitian about taking daily supplements. You may be told the following depending on your health and the cause of your kidney stones: ? Not to take supplements with vitamin C. ? To take a calcium supplement. ? To take a daily probiotic supplement. ? To take other supplements such as magnesium, fish oil, or vitamin B6.  Take over-the-counter and prescription medicines only as told by your health care provider. These include supplements. What foods should I limit? Limit your intake of the following foods, or eat them as told by your dietitian. Vegetables Spinach. Rhubarb. Beets. Canned vegetables. Angie Fava. Olives. Baked potatoes with skin. Grains Wheat bran. Baked goods. Salted crackers. Cereals high in sugar. Meats and other proteins Nuts. Nut butters. Large portions of meat, poultry, or fish. Salted, precooked, or cured meats, such as sausages, meat loaves, and hot dogs. Dairy Cheese. Beverages Regular soft drinks. Regular vegetable juice. Seasonings and condiments Seasoning blends with salt. Salad dressings. Soy sauce. Ketchup. Barbecue sauce. Other foods Canned soups. Canned pasta sauce. Casseroles. Pizza. Lasagna. Frozen meals. Potato chips. [place] fries. The items listed above may not be a complete list of foods and beverages you should limit. Contact a dietitian for more  information. What foods should I avoid? Talk to your dietitian about specific foods you should avoid based on the type of kidney stones you have and your overall health. Fruits Grapefruit. The item listed above may not be a complete list of foods and beverages you should avoid. Contact a dietitian for more information. Summary  Kidney stones are deposits of minerals and salts that form inside your kidneys.  You can lower your risk of kidney stones by making changes to your diet.  The most important thing you can do is drink enough fluid. Drink enough fluid to keep your urine pale yellow.  Talk to your dietitian about how much calcium you should have each day, and eat less salt and animal protein as told by your dietitian. This information is not intended to replace advice given to you by your health care provider. Make sure you discuss any questions you have with your health care provider. Document Revised: 04/22/2019 Document Reviewed: 04/22/2019 Elsevier Patient Education  2021 [employer].

## 2020-08-30 NOTE — Addendum Note (Signed)
Addended by: Donalee Citrin on: 08/30/2020 12:54 PM   Modules accepted: Orders

## 2020-09-01 LAB — URINALYSIS, COMPLETE
Bilirubin, UA: NEGATIVE
Glucose, UA: NEGATIVE
Nitrite, UA: NEGATIVE
Protein,UA: NEGATIVE
RBC, UA: NEGATIVE
Specific Gravity, UA: 1.025 (ref 1.005–1.030)
Urobilinogen, Ur: 0.2 mg/dL (ref 0.2–1.0)
pH, UA: 5 (ref 5.0–7.5)

## 2020-09-01 LAB — MICROSCOPIC EXAMINATION

## 2020-10-03 ENCOUNTER — Ambulatory Visit: Payer: Medicare Other | Admitting: Dermatology

## 2020-10-03 ENCOUNTER — Other Ambulatory Visit: Payer: Self-pay

## 2020-10-03 ENCOUNTER — Encounter: Payer: Self-pay | Admitting: Dermatology

## 2020-10-03 DIAGNOSIS — L219 Seborrheic dermatitis, unspecified: Secondary | ICD-10-CM

## 2020-10-03 DIAGNOSIS — D2371 Other benign neoplasm of skin of right lower limb, including hip: Secondary | ICD-10-CM

## 2020-10-03 DIAGNOSIS — L649 Androgenic alopecia, unspecified: Secondary | ICD-10-CM | POA: Diagnosis not present

## 2020-10-03 DIAGNOSIS — Z1283 Encounter for screening for malignant neoplasm of skin: Secondary | ICD-10-CM

## 2020-10-03 DIAGNOSIS — D239 Other benign neoplasm of skin, unspecified: Secondary | ICD-10-CM

## 2020-10-03 DIAGNOSIS — L814 Other melanin hyperpigmentation: Secondary | ICD-10-CM

## 2020-10-03 DIAGNOSIS — L821 Other seborrheic keratosis: Secondary | ICD-10-CM

## 2020-10-03 DIAGNOSIS — D1800 Hemangioma unspecified site: Secondary | ICD-10-CM

## 2020-10-03 DIAGNOSIS — L720 Epidermal cyst: Secondary | ICD-10-CM

## 2020-10-03 DIAGNOSIS — D229 Melanocytic nevi, unspecified: Secondary | ICD-10-CM

## 2020-10-03 NOTE — Patient Instructions (Addendum)
If you have any questions or concerns for your doctor, please call our main line at [phone] and press option 4 to reach your doctor's medical assistant. If no one answers, please leave a voicemail as directed and we will return your call as soon as possible. Messages left after 4 pm will be answered the following business day.   You may also send [place] a message via [facility]. We typically respond to MyChart messages within 1-2 business days.  For prescription refills, please ask your pharmacy to contact our office. Our fax number is [phone].  If you have an urgent issue when the clinic is closed that cannot wait until the next business day, you can page your doctor at the number below.    Please note that while we do our best to be available for urgent issues outside of office hours, we are not available 24/7.   If you have an urgent issue and are unable to reach [place], you may choose to seek medical care at your doctor's office, retail clinic, urgent care center, or emergency room.  If you have a medical emergency, please immediately call 911 or go to the emergency department.  Pager Numbers  - Dr. Nehemiah Massed: [phone]  - Dr. Laurence Ferrari: [phone]  - Dr. Nicole Kindred: [phone]  In the event of inclement weather, please call our main line at [phone] for an update on the status of any delays or closures.  Dermatology Medication Tips: Please keep the boxes that topical medications come in in order to help keep track of the instructions about where and how to use these. Pharmacies typically print the medication instructions only on the boxes and not directly on the medication tubes.   If your medication is too expensive, please contact our office at [phone] option 4 or send [place] a message through [facility].   We are unable to tell what your co-pay for medications will be in advance as this is different depending on your insurance coverage. However, we may be able to find a  substitute medication at lower cost or fill out paperwork to get insurance to cover a needed medication.   If a prior authorization is required to get your medication covered by your insurance company, please allow [place] 1-2 business days to complete this process.  Drug prices often vary depending on where the prescription is filled and some pharmacies may offer cheaper prices.  The website www.goodrx.com contains coupons for medications through different pharmacies. The prices here do not account for what the cost may be with help from insurance (it may be cheaper with your insurance), but the website can give you the price if you did not use any insurance.  - You can print the associated coupon and take it with your prescription to the pharmacy.  - You may also stop by our office during regular business hours and pick up a GoodRx coupon card.  - If you need your prescription sent electronically to a different pharmacy, notify our office through [facility] or by phone at [phone] option 4.    Melanoma ABCDEs  Melanoma is the most dangerous type of skin cancer, and is the leading cause of death from skin disease.  You are more likely to develop melanoma if you:  Have light-colored skin, light-colored eyes, or red or blond hair  Spend a lot of time in the sun  Tan regularly, either outdoors or in a tanning bed  Have had blistering sunburns, especially during childhood  Have a  close family member who has had a melanoma  Have atypical moles or large birthmarks  Early detection of melanoma is key since treatment is typically straightforward and cure rates are extremely high if we catch it early.   The first sign of melanoma is often a change in a mole or a new dark spot.  The ABCDE system is a way of remembering the signs of melanoma.  A for asymmetry:  The two halves do not match. B for border:  The edges of the growth are irregular. C for color:  A mixture of colors are  present instead of an even brown color. D for diameter:  Melanomas are usually (but not always) greater than 41mm - the size of a pencil eraser. E for evolution:  The spot keeps changing in size, shape, and color.  Please check your skin once per month between visits. You can use a small mirror in front and a large mirror behind you to keep an eye on the back side or your body.   If you see any new or changing lesions before your next follow-up, please call to schedule a visit.  Please continue daily skin protection including broad spectrum sunscreen SPF 30+ to sun-exposed areas, reapplying every 2 hours as needed when you're outdoors.     Recommend taking Heliocare sun protection supplement daily in sunny weather for additional sun protection. For maximum protection on the sunniest days, you can take up to 2 capsules of regular Heliocare OR take 1 capsule of Heliocare Ultra. For prolonged exposure (such as a full day in the sun), you can repeat your dose of the supplement 4 hours after your first dose. Heliocare can be purchased at [facility] or at [email or web address].       Recommend Avlon Keracare anti-dandruff shampoo and conditioner at least once weekly, leaving shampoo on for about 10 minutes before rinsing and conditioning.

## 2020-10-03 NOTE — Progress Notes (Signed)
Follow-Up Visit   Subjective  Marcia Blake is a 69 y.o. female who presents for the following: Annual Exam (Mole check ). Pt c/o blackheads on her back getting larger and recheck her scalp for hair loss.   The patient presents for Total-Body Skin Exam (TBSE) for skin cancer screening and mole check.   The following portions of the chart were reviewed this encounter and updated as appropriate:   Tobacco  Allergies  Meds  Problems  Med Hx  Surg Hx  Fam Hx      Review of Systems:  No other skin or systemic complaints except as noted in HPI or Assessment and Plan.  Objective  Well appearing patient in no apparent distress; mood and affect are within normal limits.  A full examination was performed including scalp, head, eyes, ears, nose, lips, neck, chest, axillae, abdomen, back, buttocks, bilateral upper extremities, bilateral lower extremities, hands, feet, fingers, toes, fingernails, and toenails. All findings within normal limits unless otherwise noted below.  Objective  Scalp: Diffuse thinning of the crown and widening of the midline part with retention of the frontal hairline - Reviewed progressive nature and prognosis.   Objective  Right Thigh: Firm pink/brown papulenodule with dimple sign.   Objective  scalp, ears: Pink patches with greasy scale.   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 Condition is bothersome to patient. Not currently at goal.  She advised she did not try Rogaine due to concern about losing hair anyway if she stopped using it.   Discussed treatment options including Rogaine, topical minoxidil/spironolactone/finasteride and prescription finasteride tablets. Advised that all can be effective but that they do all require continued use to maintain the hair regrowth.   Patient defers treatment at this time, will  think about it.   Dermatofibroma Right Thigh  Benign-appearing.  Observation.  Call clinic for new or changing moles.  Recommend daily use of broad spectrum spf 30+ sunscreen to sun-exposed areas.    Seborrheic dermatitis scalp, ears  Chronic condition with duration over one year. Condition is bothersome to patient. Not currently at goal.  Seborrheic Dermatitis  -  is a chronic persistent rash characterized by pinkness and scaling most commonly of the mid face but also can occur on the scalp (dandruff), ears; mid chest and mid back. It tends to be exacerbated by stress and cooler weather.  People who have neurologic disease may experience new onset or exacerbation of existing seborrheic dermatitis.  The condition is not curable but treatable and can be controlled.   Recommend Avlon Keracare anti-dandruff shampoo and conditioner at least once weekly, leaving shampoo on for about 10 minutes before rinsing and conditioning.    Restart Fluocinonide 0.05% solution use daily as needed. Avoid applying to face, groin, and axilla. Use as directed. Risk of skin atrophy with long-term use reviewed.    Other Related Medications fluocinonide (LIDEX) 0.05 % external solution ketoconazole (NIZORAL) 2 % shampoo  Epidermal inclusion cyst Left back  Benign-appearing.  Observation.  Call clinic for new or changing moles.  Recommend daily use of broad spectrum spf 30+ sunscreen to sun-exposed areas.     Lentigines - Scattered tan macules - Due to sun exposure - Benign-appering, observe - Recommend daily broad spectrum sunscreen SPF 30+ to sun-exposed areas, reapply every 2 hours as needed. - Call for any changes  Seborrheic Keratoses - Stuck-on, waxy, tan-brown  papules and/or plaques  - Benign-appearing - Discussed benign etiology and prognosis. - Observe - Call for any changes  Melanocytic Nevi - Tan-brown and/or pink-flesh-colored symmetric macules and papules - Benign appearing on exam  today - Observation - Call clinic for new or changing moles - Recommend daily use of broad spectrum spf 30+ sunscreen to sun-exposed areas.   Hemangiomas - Red papules - Discussed benign nature - Observe - Call for any changes  - Recommend daily broad spectrum sunscreen SPF 30+ to sun-exposed areas, reapply every 2 hours as needed.  - Staying in the shade or wearing long sleeves, sun glasses (UVA+UVB protection) and wide brim hats (4-inch brim around the entire circumference of the hat) are also recommended for sun protection.  - Call for new or changing lesions.  Skin cancer screening performed today.  Return in about 1 year (around 10/03/2021) for TBSE .  I, Marye Round, CMA, am acting as scribe for Forest Gleason, MD .  Documentation: I have reviewed the above documentation for accuracy and completeness, and I agree with the above.  Forest Gleason, MD

## 2020-10-27 NOTE — Telephone Encounter (Signed)
Called patient and explained UA results and low WBC with skin contamination, UA mostly unremarkable. Patient not currently having symptoms

## 2020-11-02 ENCOUNTER — Ambulatory Visit: Payer: Medicare Other | Admitting: Oncology

## 2020-11-02 ENCOUNTER — Other Ambulatory Visit: Payer: Medicare Other

## 2020-11-16 ENCOUNTER — Other Ambulatory Visit: Payer: Self-pay

## 2020-11-16 DIAGNOSIS — Z85020 Personal history of malignant carcinoid tumor of stomach: Secondary | ICD-10-CM

## 2020-11-16 DIAGNOSIS — Z08 Encounter for follow-up examination after completed treatment for malignant neoplasm: Secondary | ICD-10-CM

## 2020-11-17 ENCOUNTER — Encounter: Payer: Self-pay | Admitting: Oncology

## 2020-11-17 ENCOUNTER — Inpatient Hospital Stay (HOSPITAL_BASED_OUTPATIENT_CLINIC_OR_DEPARTMENT_OTHER): Payer: Medicare Other | Admitting: Oncology

## 2020-11-17 ENCOUNTER — Other Ambulatory Visit: Payer: Self-pay

## 2020-11-17 ENCOUNTER — Inpatient Hospital Stay: Payer: Medicare Other | Attending: Oncology

## 2020-11-17 VITALS — BP 125/75 | HR 72 | Wt 174.3 lb

## 2020-11-17 DIAGNOSIS — Z9013 Acquired absence of bilateral breasts and nipples: Secondary | ICD-10-CM | POA: Diagnosis not present

## 2020-11-17 DIAGNOSIS — Z08 Encounter for follow-up examination after completed treatment for malignant neoplasm: Secondary | ICD-10-CM

## 2020-11-17 DIAGNOSIS — Z853 Personal history of malignant neoplasm of breast: Secondary | ICD-10-CM

## 2020-11-17 DIAGNOSIS — Z87891 Personal history of nicotine dependence: Secondary | ICD-10-CM | POA: Insufficient documentation

## 2020-11-17 DIAGNOSIS — Z85020 Personal history of malignant carcinoid tumor of stomach: Secondary | ICD-10-CM

## 2020-11-17 DIAGNOSIS — Z86000 Personal history of in-situ neoplasm of breast: Secondary | ICD-10-CM | POA: Diagnosis not present

## 2020-11-17 LAB — COMPREHENSIVE METABOLIC PANEL
ALT: 17 U/L (ref 0–44)
AST: 23 U/L (ref 15–41)
Albumin: 4.2 g/dL (ref 3.5–5.0)
Alkaline Phosphatase: 72 U/L (ref 38–126)
Anion gap: 6 (ref 5–15)
BUN: 17 mg/dL (ref 8–23)
CO2: 30 mmol/L (ref 22–32)
Calcium: 9.7 mg/dL (ref 8.9–10.3)
Chloride: 102 mmol/L (ref 98–111)
Creatinine, Ser: 0.8 mg/dL (ref 0.44–1.00)
GFR, Estimated: >60 mL/min (ref >60)
Glucose, Bld: 95 mg/dL (ref 70–99)
Potassium: 3.7 mmol/L (ref 3.5–5.1)
Sodium: 138 mmol/L (ref 135–145)
Total Bilirubin: 0.4 mg/dL (ref 0.3–1.2)
Total Protein: 7.5 g/dL (ref 6.5–8.1)

## 2020-11-17 LAB — CBC WITH DIFFERENTIAL/PLATELET
Abs Immature Granulocytes: 0.01 10*3/uL (ref 0.00–0.07)
Basophils Absolute: 0.1 10*3/uL (ref 0.0–0.1)
Basophils Relative: 1 %
Eosinophils Absolute: 0.3 10*3/uL (ref 0.0–0.5)
Eosinophils Relative: 4 %
HCT: 37.2 % (ref 36.0–46.0)
Hemoglobin: 12 g/dL (ref 12.0–15.0)
Immature Granulocytes: 0 %
Lymphocytes Relative: 35 %
Lymphs Abs: 2.5 10*3/uL (ref 0.7–4.0)
MCH: 29.9 pg (ref 26.0–34.0)
MCHC: 32.3 g/dL (ref 30.0–36.0)
MCV: 92.8 fL (ref 80.0–100.0)
Monocytes Absolute: 0.6 10*3/uL (ref 0.1–1.0)
Monocytes Relative: 8 %
Neutro Abs: 3.7 10*3/uL (ref 1.7–7.7)
Neutrophils Relative %: 52 %
Platelets: 237 10*3/uL (ref 150–400)
RBC: 4.01 MIL/uL (ref 3.87–5.11)
RDW: 12.3 % (ref 11.5–15.5)
WBC: 7.1 10*3/uL (ref 4.0–10.5)
nRBC: 0 % (ref 0.0–0.2)

## 2020-11-17 NOTE — Progress Notes (Addendum)
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  This was followed by a right breast lumpectomy 6 weeks of radiation treatment and 5 years of tamoxifen.  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  She underwent bilateral mastectomy at that time.  Left breast mastectomy showed sclerosing fibroadenoma with calcifications 0.8 cm completely excised.  No evidence of malignancy.  Right breast mastectomy showed no residual lesion pTis NX.  She did not require adjuvant hormone therapy after bilateral mastectomy.   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  She has been getting chromogranin A levels monitored which have been chronically elevated at 123 and 151 in the past.  Colonoscopy was negative and endoscopy report showed mild inflammatory changes at anastomosis which was widely patent and performed back in 2015.  Interval history-patient is doing well presently and is at her baseline state of health.  Denies any specific complaints at this time.  Appetite and weight have remained stable.  Denies any new aches and pains anywhere  ECOG PS- 1 Pain scale- 0  Review of systems- Review of Systems  Constitutional:  Negative for chills, fever, malaise/fatigue and weight loss.  HENT:  Negative for congestion, ear  discharge and nosebleeds.   Eyes:  Negative for blurred vision.  Respiratory:  Negative for cough, hemoptysis, sputum production, shortness of breath and wheezing.   Cardiovascular:  Negative for chest pain, palpitations, orthopnea and claudication.  Gastrointestinal:  Negative for abdominal pain, blood in stool, constipation, diarrhea, heartburn, melena, nausea and vomiting.  Genitourinary:  Negative for dysuria, flank pain, frequency, hematuria and urgency.  Musculoskeletal:  Negative for back pain, joint pain and myalgias.  Skin:  Negative for rash.  Neurological:  Negative for dizziness, tingling, focal weakness, seizures, weakness and headaches.  Endo/Heme/Allergies:  Does not bruise/bleed easily.  Psychiatric/Behavioral:  Negative for depression and suicidal ideas. The patient does not have insomnia.      Allergies  Allergen Reactions   Elemental Sulfur Rash   Penicillins Rash     Past Medical History:  Diagnosis Date   Cancer ([place]) [phone]   breast-rt.   Diabetes mellitus without complication [facility])      Past Surgical History:  Procedure Laterality Date   bilateral mastectomies Bilateral    BREAST SURGERY     COLONOSCOPY WITH PROPOFOL N/A 02/04/2019   Procedure: COLONOSCOPY WITH PROPOFOL;  Surgeon: Lin Landsman, MD;  Location: [facility] ENDOSCOPY;  Service: Gastroenterology;  Laterality: N/A;   [place] KOMAN RT BREAST ([place] [facility]) Right     Social History   Socioeconomic History   Marital status: Divorced    Spouse name: Not on file   Number of children: Not on file   Years of education: Not on file   Highest education level: Not on file  Occupational History   Not on file  Tobacco Use  Smoking status: Former    Pack years: 0.00    Types: Cigarettes    Quit date: 1970    Years since quitting: 52.5   Smokeless tobacco: Never  Vaping Use   Vaping Use: Never used  Substance and Sexual Activity   Alcohol use: Yes    Comment: socially   Drug use: Never    Sexual activity: Not on file  Other Topics Concern   Not on file  Social History Narrative   Not on file   Social Determinants of Health   Financial Resource Strain: Not on file  Food Insecurity: Not on file  Transportation Needs: Not on file  Physical Activity: Not on file  Stress: Not on file  Social Connections: Not on file  Intimate Partner Violence: Not on file    Family History  Problem Relation Age of Onset   Diabetes Sister      Current Outpatient Medications:    acetaminophen (TYLENOL) 500 MG tablet, Take 500 mg by mouth every 6 (six) hours as needed., Disp: , Rfl:    aspirin 81 MG EC tablet, Take 81 mg by mouth daily., Disp: , Rfl:    atorvastatin (LIPITOR) 20 MG tablet, Take 20 mg by mouth daily., Disp: , Rfl:    Biotin 5 MG TABS, Take 5 mg by mouth daily., Disp: , Rfl:    Cholecalciferol 50 MCG (2000 UT) TABS, Take 2,000 Units by mouth daily., Disp: , Rfl:    Multiple Vitamin (MULTIVITAMIN) capsule, Take 1 capsule by mouth daily., Disp: , Rfl:    fluocinonide (LIDEX) 0.05 % external solution, Use daily as needed to affected areas on scalp, ears for dryness. (Patient not taking: Reported on 11/17/2020), Disp: 180 mL, Rfl: 3   ketoconazole (NIZORAL) 2 % shampoo, Massage into scalp and ears as needed 1-3 times a week, leave on for 10 minutes before washing out. (Patient not taking: Reported on 11/17/2020), Disp: 120 mL, Rfl: 11   triamcinolone ointment (KENALOG) 0.1 %, Apply 1 application topically 2 (two) times daily as needed. (Patient not taking: Reported on 11/17/2020), Disp: , Rfl:   Physical exam:  Vitals:   11/17/20 1306  BP: 125/75  Pulse: 72  SpO2: 100%  Weight: 174 lb 4.8 oz (79.1 kg)   Physical Exam Constitutional:      General: She is not in acute distress. Cardiovascular:     Rate and Rhythm: Normal rate and regular rhythm.     Heart sounds: Normal heart sounds.  Pulmonary:     Effort: Pulmonary effort is normal.     Breath sounds: Normal breath  sounds.  Abdominal:     General: Bowel sounds are normal.     Palpations: Abdomen is soft.  Skin:    General: Skin is warm and dry.  Neurological:     Mental Status: She is alert and oriented to person, place, and time.   Breast exam is performed in seated and lying down position. Patient is status post bilateral mastectomy with reconstruction. The implant edges are intact and there is no evidence of any chest wall recurrence. No evidence of bilateral axillary adenopathy     CMP Latest Ref Rng & Units 11/17/2020  Glucose 70 - 99 mg/dL 95  BUN 8 - 23 mg/dL 17  Creatinine 0.44 - 1.00 mg/dL 0.80  Sodium 135 - 145 mmol/L 138  Potassium 3.5 - 5.1 mmol/L 3.7  Chloride 98 - 111 mmol/L 102  CO2 22 - 32 mmol/L 30  Calcium 8.9 -  10.3 mg/dL 9.7  Total Protein 6.5 - 8.1 g/dL 7.5  Total Bilirubin 0.3 - 1.2 mg/dL 0.4  Alkaline Phos 38 - 126 U/L 72  AST 15 - 41 U/L 23  ALT 0 - 44 U/L 17   CBC Latest Ref Rng & Units 11/17/2020  WBC 4.0 - 10.5 K/uL 7.1  Hemoglobin 12.0 - 15.0 g/dL 12.0  Hematocrit 36.0 - 46.0 % 37.2  Platelets 150 - 400 K/uL 237    Assessment and plan- Patient is a 70 y.o. female with prior history of breast cancer and gastric carcinoid here for routine follow-up  Patient had history of breast cancer in 1996 and then had a right breast DCIS ER positive in 2018.  She is now s/p bilateral mastectomy.  Clinically she is doing well with no evidence of chest wall recurrence.  She does not require any surveillance imaging or tumor marker testing at this time.  2.  History of gastric carcinoid in 2011: Chromogranin A levels will be checked on a yearly basis.  Scans from February 2022 showed no evidence of recurrence   Visit Diagnosis 1. Encounter for follow-up surveillance of breast cancer      Dr. Randa Evens, MD, MPH [facility] [phone] 11/17/2020 4:18 PM

## 2020-11-21 LAB — CHROMOGRANIN A: Chromogranin A (ng/mL): 235.9 ng/mL — ABNORMAL HIGH (ref 0.0–101.8)

## 2020-11-22 ENCOUNTER — Encounter: Payer: Self-pay | Admitting: Oncology

## 2021-01-05 ENCOUNTER — Ambulatory Visit (INDEPENDENT_AMBULATORY_CARE_PROVIDER_SITE_OTHER): Payer: Medicare Other

## 2021-01-05 ENCOUNTER — Encounter: Payer: Self-pay | Admitting: Podiatry

## 2021-01-05 ENCOUNTER — Ambulatory Visit: Payer: Medicare Other | Admitting: Podiatry

## 2021-01-05 ENCOUNTER — Other Ambulatory Visit: Payer: Self-pay

## 2021-01-05 DIAGNOSIS — M779 Enthesopathy, unspecified: Secondary | ICD-10-CM

## 2021-01-05 DIAGNOSIS — M7751 Other enthesopathy of right foot: Secondary | ICD-10-CM | POA: Diagnosis not present

## 2021-01-05 DIAGNOSIS — M7752 Other enthesopathy of left foot: Secondary | ICD-10-CM | POA: Diagnosis not present

## 2021-01-05 MED ORDER — BETAMETHASONE SOD PHOS & ACET 6 (3-3) MG/ML IJ SUSP
3.0000 mg | Freq: Once | INTRAMUSCULAR | Status: AC
  Administered 2021-01-05: 3 mg via INTRA_ARTICULAR

## 2021-01-05 NOTE — Progress Notes (Signed)
   Subjective:  70 y.o. female presenting today as a new patient for evaluation of bilateral ankle pain is been going on for few months now.  Patient states that its not necessarily painful but it feels very tight.  The tightness extends from her ankles down to her toes.  She presents for further treatment and evaluation   Past Medical History:  Diagnosis Date   Cancer [facility]) [phone]   breast-rt.   Diabetes mellitus without complication ([place])      Objective / Physical Exam:  General:  The patient is alert and oriented x3 in no acute distress. Dermatology:  Skin is warm, dry and supple bilateral lower extremities. Negative for open lesions or macerations. Vascular:  Palpable pedal pulses bilaterally. No edema or erythema noted. Capillary refill within normal limits. Neurological:  Epicritic and protective threshold grossly intact bilaterally.  Musculoskeletal Exam:  Pain on palpation to the anterior lateral medial aspects of the patient's bilateral ankles.  Negative for any significant edema.  There is pain with plantar flexion of the ankle joint especially to the anterior aspect of the ankle. . Range of motion within normal limits to all pedal and ankle joints bilateral. Muscle strength 5/5 in all groups bilateral.   Radiographic Exam:  Normal osseous mineralization. Joint spaces preserved. No fracture/dislocation/boney destruction.    Assessment: 1.  Anterior ankle pain bilateral  Plan of Care:  1. Patient was evaluated. X-Rays reviewed.  2. Injection of 0.5 mL Celestone Soluspan injected in the patient's bilateral ankle joints.  3.  Recommend daily stretching exercises 4.  Patient declined oral prescription medication 5.  Return to clinic in 4 weeks  *Moved from [place].  Lived here for 5 years.  I treat her daughter.   Edrick Kins, DPM [facility]  Dr. Edrick Kins, DPM    [address], [place] [address]                 Office [phone]  Fax [phone]

## 2021-02-12 NOTE — Telephone Encounter (Signed)
Let her come for chromograning level again this wek. If there is a consistent rise in her number , we will get ct scan

## 2021-02-13 ENCOUNTER — Ambulatory Visit: Payer: Medicare Other | Admitting: Podiatry

## 2021-02-13 ENCOUNTER — Other Ambulatory Visit: Payer: Self-pay | Admitting: *Deleted

## 2021-02-13 DIAGNOSIS — Z85020 Personal history of malignant carcinoid tumor of stomach: Secondary | ICD-10-CM

## 2021-02-14 ENCOUNTER — Inpatient Hospital Stay: Payer: Medicare Other | Attending: Oncology

## 2021-02-14 ENCOUNTER — Other Ambulatory Visit: Payer: Self-pay

## 2021-02-14 DIAGNOSIS — Z85020 Personal history of malignant carcinoid tumor of stomach: Secondary | ICD-10-CM | POA: Diagnosis not present

## 2021-02-15 LAB — CHROMOGRANIN A: Chromogranin A (ng/mL): 194.8 ng/mL — ABNORMAL HIGH (ref 0.0–101.8)

## 2021-02-19 ENCOUNTER — Encounter: Payer: Self-pay | Admitting: *Deleted

## 2021-02-21 ENCOUNTER — Other Ambulatory Visit: Payer: Self-pay | Admitting: *Deleted

## 2021-02-21 DIAGNOSIS — Z85020 Personal history of malignant carcinoid tumor of stomach: Secondary | ICD-10-CM

## 2021-05-21 ENCOUNTER — Inpatient Hospital Stay: Payer: Medicare Other | Attending: Oncology

## 2021-05-21 ENCOUNTER — Other Ambulatory Visit: Payer: Self-pay

## 2021-05-21 DIAGNOSIS — Z85020 Personal history of malignant carcinoid tumor of stomach: Secondary | ICD-10-CM

## 2021-05-21 DIAGNOSIS — Z853 Personal history of malignant neoplasm of breast: Secondary | ICD-10-CM | POA: Insufficient documentation

## 2021-05-22 LAB — CHROMOGRANIN A: Chromogranin A (ng/mL): 198.3 ng/mL — ABNORMAL HIGH (ref 0.0–101.8)

## 2021-05-29 ENCOUNTER — Encounter: Payer: Self-pay | Admitting: Oncology

## 2021-07-14 IMAGING — CT CT ABD-PELV W/ CM
2 of 5 series · 17 of 46 positions shown, 19 images · IV contrast (omnipaque)
Comparison: None.

CLINICAL DATA: Follow-up gastric carcinoid, elevated chromogranin
levels

EXAM:
CT ABDOMEN AND PELVIS WITH CONTRAST
TECHNIQUE: Multidetector CT imaging of the abdomen and pelvis was performed
using the standard protocol following bolus administration of
intravenous contrast.
CONTRAST:  100mL OMNIPAQUE IOHEXOL 300 MG/ML  SOLN

[Series 2: abd pelvis 5.00 · axial · 0.86mm/px · z∈[-1485,-1090]mm · 14 of 89 slices shown, 16 images]
[im 5/89  soft-tissue]
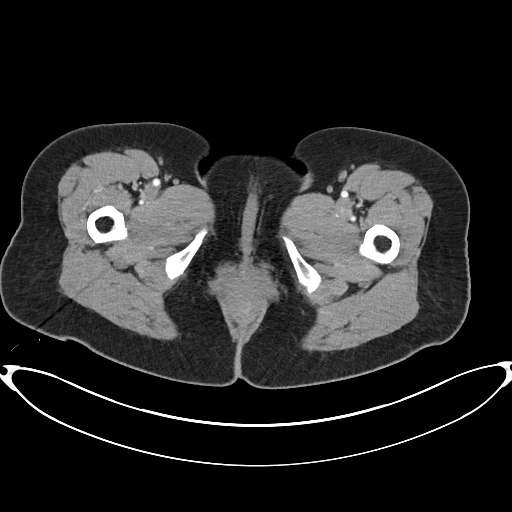
[im 5/89  bone]
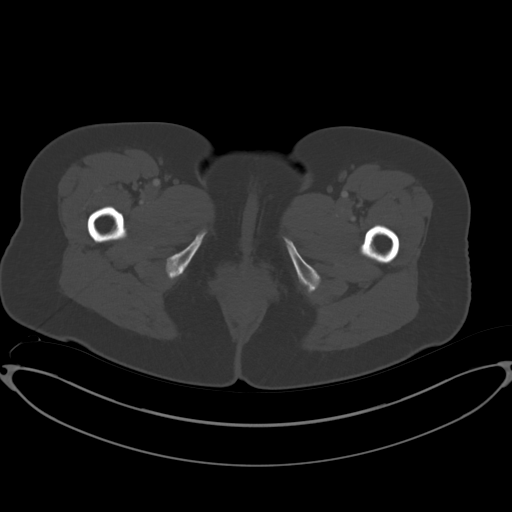
[im 10/89  soft-tissue]
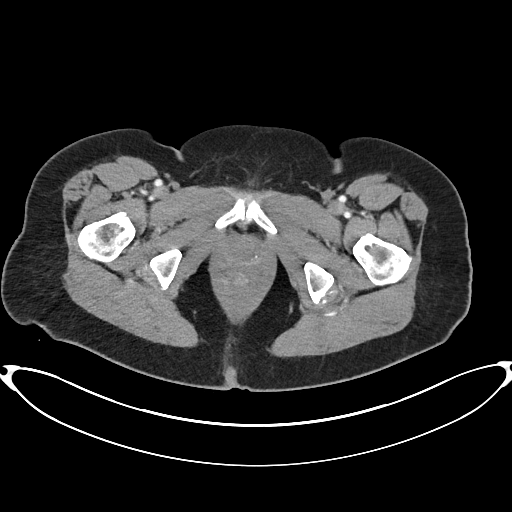
[im 20/89  soft-tissue]
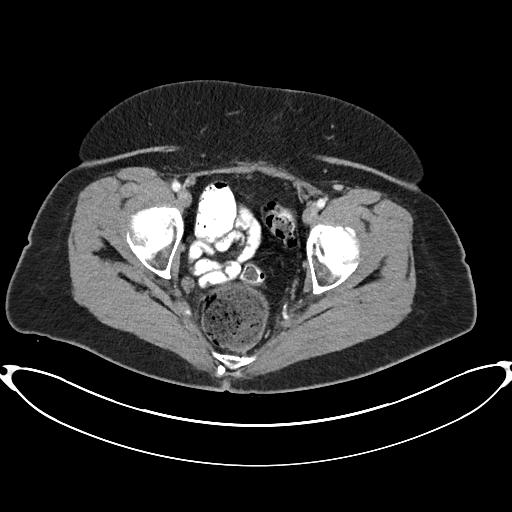
[im 25/89  soft-tissue]
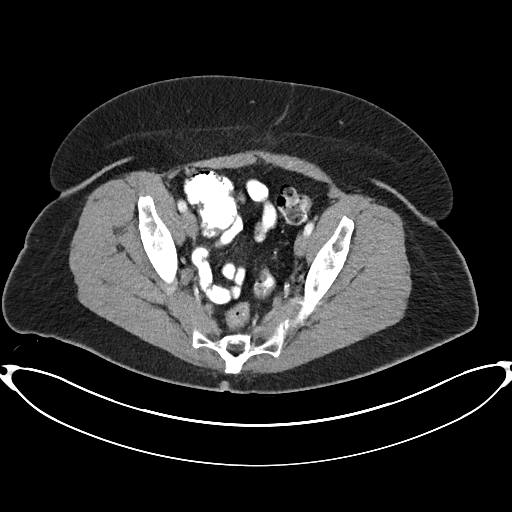
[im 30/89  soft-tissue]
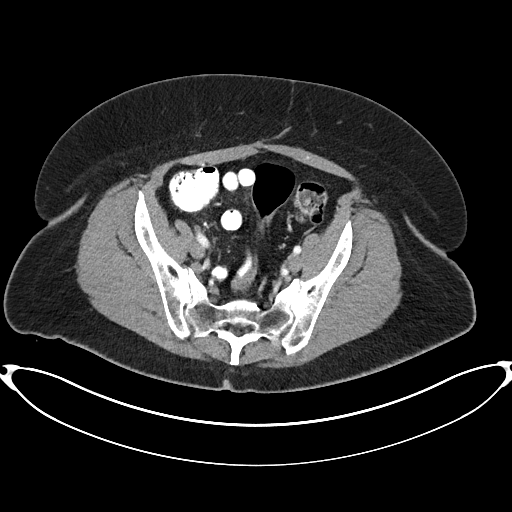
[im 35/89  soft-tissue]
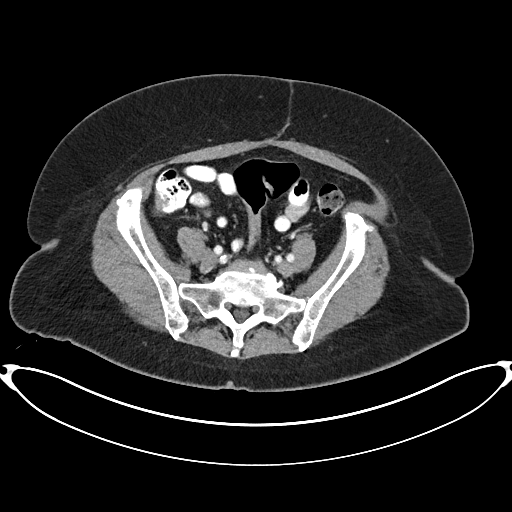
[im 40/89  soft-tissue]
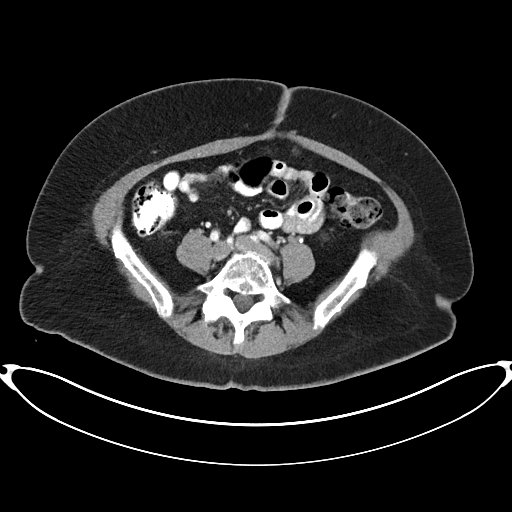
[im 49/89  soft-tissue]
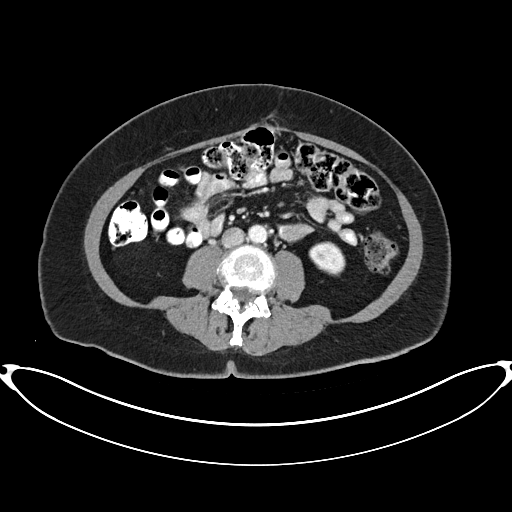
[im 54/89  soft-tissue]
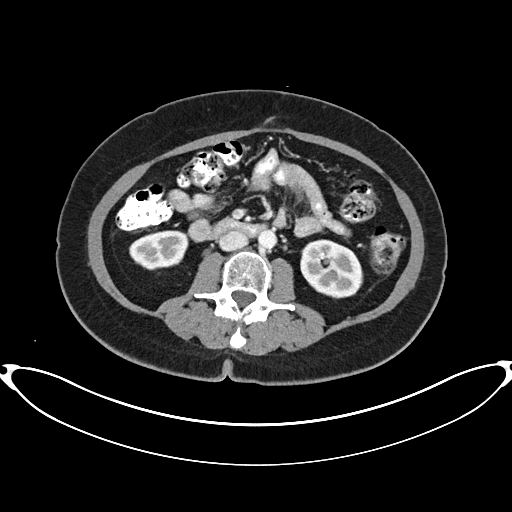
[im 54/89  bone]
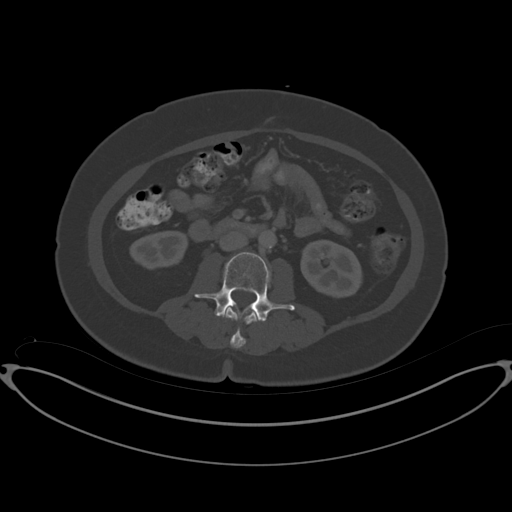
[im 59/89  soft-tissue]
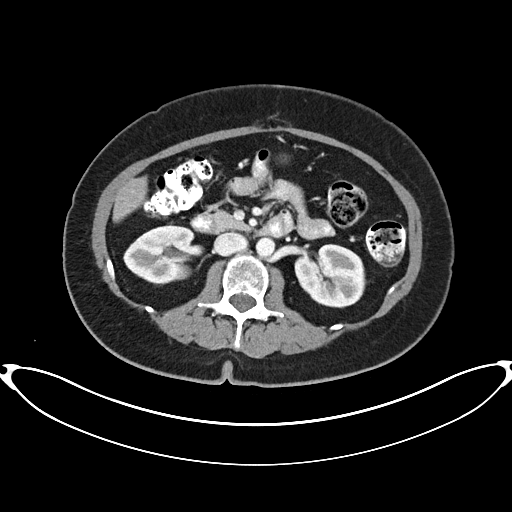
[im 64/89  soft-tissue]
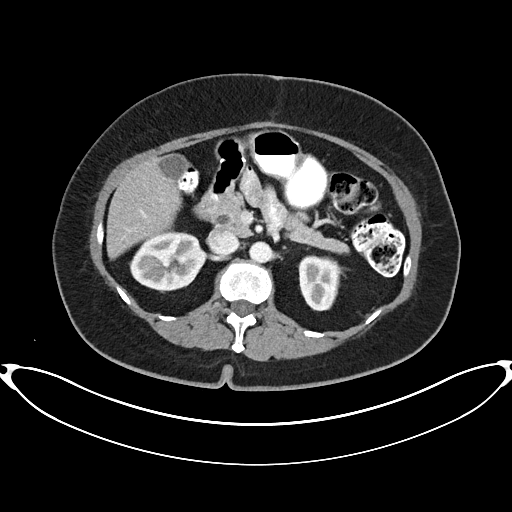
[im 69/89  soft-tissue]
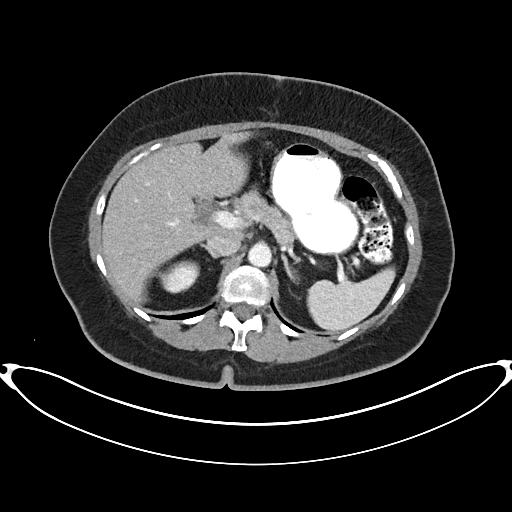
[im 79/89  soft-tissue]
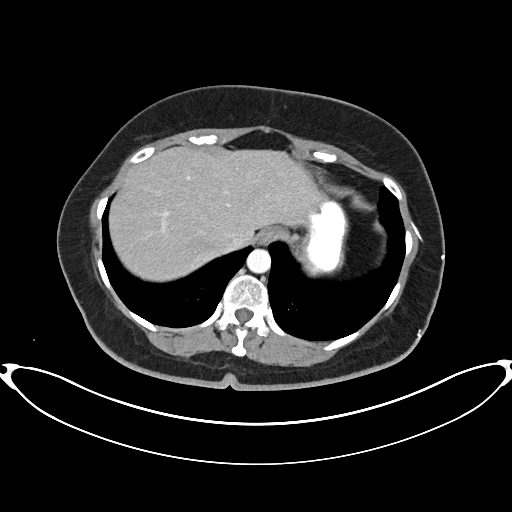
[im 84/89  soft-tissue]
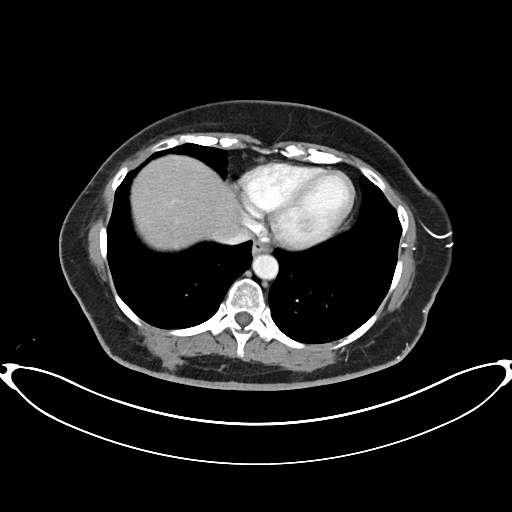

[Series 4: coronals abd pelvis 2.00 cor · coronal · 0.82mm/px · 3 of 140 slices shown]
[im 47/140  soft-tissue]
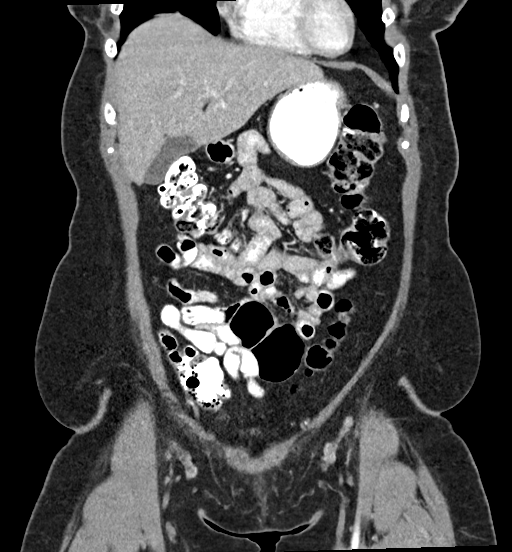
[im 62/140  soft-tissue]
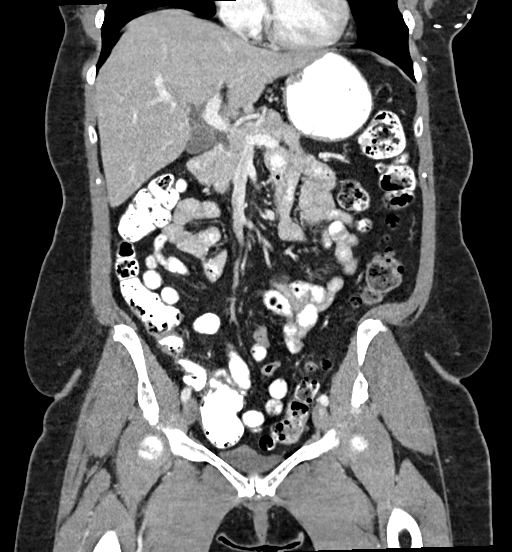
[im 78/140  soft-tissue]
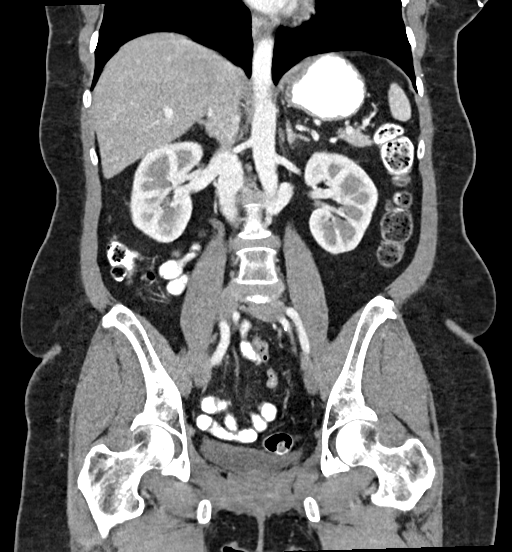

[17 of 46 positions shown; findings below may reference images not displayed]

FINDINGS: Lower chest: Lung bases are clear.

Hepatobiliary: Liver is within normal limits. No
suspicious/enhancing hepatic lesions.

Gallbladder is unremarkable. No intrahepatic or extrahepatic ductal
dilatation.

Pancreas: Within normal limits.

Spleen: Within normal limits.

Adrenals/Urinary Tract: Adrenal glands are within normal limits.

Kidneys are within normal limits.  No hydronephrosis.

Bladder is within normal limits.

Stomach/Bowel: Postsurgical changes involving the gastric antrum.

No evidence of bowel obstruction.

Appendix is not discretely visualized.

Mild left colonic diverticulosis, without evidence of
diverticulitis.

Vascular/Lymphatic: No evidence of abdominal aortic aneurysm.

Atherosclerotic calcifications of the abdominal aorta and branch
vessels. Retroaortic left renal vein.

No suspicious abdominopelvic lymphadenopathy.

Reproductive: Status post hysterectomy.

No adnexal masses.

Other: No abdominopelvic ascites.

Diastasis of the midline anterior abdominal wall.

Musculoskeletal: Visualized osseous structures are within normal
limits.
IMPRESSION: Postsurgical changes involving the gastric antrum.

No findings suspicious for recurrent or metastatic disease.

## 2021-08-30 ENCOUNTER — Ambulatory Visit
Admission: RE | Admit: 2021-08-30 | Discharge: 2021-08-30 | Disposition: A | Discharge status: Home / Self Care | Payer: Medicare Other | Attending: Urology | Admitting: Urology

## 2021-08-30 ENCOUNTER — Ambulatory Visit
Admission: RE | Admit: 2021-08-30 | Discharge: 2021-08-30 | Disposition: A | Discharge status: Home / Self Care | Payer: Medicare Other | Source: Ambulatory Visit | Attending: Urology | Admitting: Urology

## 2021-08-30 ENCOUNTER — Ambulatory Visit: Payer: Medicare Other | Admitting: Urology

## 2021-08-30 ENCOUNTER — Encounter: Payer: Self-pay | Admitting: Urology

## 2021-08-30 VITALS — BP 108/73 | HR 82 | Ht 62.0 in | Wt 171.0 lb

## 2021-08-30 DIAGNOSIS — N200 Calculus of kidney: Secondary | ICD-10-CM | POA: Insufficient documentation

## 2021-08-30 DIAGNOSIS — Z87442 Personal history of urinary calculi: Secondary | ICD-10-CM

## 2021-08-30 NOTE — Progress Notes (Signed)
? ?  08/30/2021 ?10:27 AM  ? ?Marcia Blake ?1950/07/16 ?[id number] ? ?Reason for visit: Follow up nephrolithiasis ? ?HPI: ?71 year old female with history of neuroendocrine tumor status post bowel resection who has had 3 prior stone episodes.  1 of these required shockwave lithotripsy in 2018 at [facility], and stone type was calcium oxalate.  Most recent CT in January 2022 showed no hydronephrosis or nephrolithiasis.  She denies any stone episodes over the last year.  She rarely will have some lower abdominal pressure discomfort and rare burning with urination.  Urinalysis a few months ago with PCP was contaminated but benign appearing.  She denies any UTI type symptoms today. ? ?I personally reviewed and interpreted her KUB today that shows no obvious evidence of radiopaque stone disease. ? ?We discussed general stone prevention strategies including adequate hydration with goal of producing 2.5 L of urine daily, increasing citric acid intake, increasing calcium intake during high oxalate meals, minimizing animal protein, and decreasing salt intake. Information about dietary recommendations given today.  ? ?Regarding her occasional lower abdominal pressure and rare dysuria, I recommended considering cranberry tablets to help with some discomfort and potentially UTI prevention, though she has not had any recent UTIs. ? ?RTC 1 year KUB prior ? ? ?Billey Co, MD ? ?[name] ?[address], [address] ?[place], [place] [address] ?[phone] ? ?

## 2021-08-30 NOTE — Patient Instructions (Signed)
Dietary Guidelines to Help Prevent Kidney Stones Kidney stones are deposits of minerals and salts that form inside your kidneys. Your risk of developing kidney stones may be greater depending on your diet, your lifestyle, the medicines you take, and whether you have certain medical conditions. Most people can lower their chances of developing kidney stones by following the instructions below. Your dietitian may give you more specific instructions depending on your overall health and the type of kidney stones you tend to develop. What are tips for following this plan? Reading food labels  Choose foods with "no salt added" or "low-salt" labels. Limit your salt (sodium) intake to less than 1,500 mg a day. Choose foods with calcium for each meal and snack. Try to eat about 300 mg of calcium at each meal. Foods that contain 200-500 mg of calcium a serving include: 8 oz (237 mL) of milk, calcium-fortifiednon-dairy milk, and calcium-fortifiedfruit juice. Calcium-fortified means that calcium has been added to these drinks. 8 oz (237 mL) of kefir, yogurt, and soy yogurt. 4 oz (114 g) of tofu. 1 oz (28 g) of cheese. 1 cup (150 g) of dried figs. 1 cup (91 g) of cooked broccoli. One 3 oz (85 g) can of sardines or mackerel. Most people need 1,000-1,500 mg of calcium a day. Talk to your dietitian about how much calcium is recommended for you. Shopping Buy plenty of fresh fruits and vegetables. Most people do not need to avoid fruits and vegetables, even if these foods contain nutrients that may contribute to kidney stones. When shopping for convenience foods, choose: Whole pieces of fruit. Pre-made salads with dressing on the side. Low-fat fruit and yogurt smoothies. Avoid buying frozen meals or prepared deli foods. These can be high in sodium. Look for foods with live cultures, such as yogurt and kefir. Choose high-fiber grains, such as whole-wheat breads, oat bran, and wheat cereals. Cooking Do not add  salt to food when cooking. Place a salt shaker on the table and allow each person to add his or her own salt to taste. Use vegetable protein, such as beans, textured vegetable protein (TVP), or tofu, instead of meat in pasta, casseroles, and soups. Meal planning Eat less salt, if told by your dietitian. To do this: Avoid eating processed or pre-made food. Avoid eating fast food. Eat less animal protein, including cheese, meat, poultry, or fish, if told by your dietitian. To do this: Limit the number of times you have meat, poultry, fish, or cheese each week. Eat a diet free of meat at least 2 days a week. Eat only one serving each day of meat, poultry, fish, or seafood. When you prepare animal protein, cut pieces into small portion sizes. For most meat and fish, one serving is about the size of the palm of your hand. Eat at least five servings of fresh fruits and vegetables each day. To do this: Keep fruits and vegetables on hand for snacks. Eat one piece of fruit or a handful of berries with breakfast. Have a salad and fruit at lunch. Have two kinds of vegetables at dinner. Limit foods that are high in a substance called oxalate. These include: Spinach (cooked), rhubarb, beets, sweet potatoes, and Swiss chard. Peanuts. Potato chips, french fries, and baked potatoes with skin on. Nuts and nut products. Chocolate. If you regularly take a diuretic medicine, make sure to eat at least 1 or 2 servings of fruits or vegetables that are high in potassium each day. These include: Avocado. Banana. Orange, prune,   carrot, or tomato juice. Baked potato. Cabbage. Beans and split peas. Lifestyle  Drink enough fluid to keep your urine pale yellow. This is the most important thing you can do. Spread your fluid intake throughout the day. If you drink alcohol: Limit how much you use to: 0-1 drink a day for women who are not pregnant. 0-2 drinks a day for men. Be aware of how much alcohol is in your  drink. In the [place] one drink equals one 12 oz bottle of beer (355 mL), one 5 oz glass of wine (148 mL), or one 1 oz glass of hard liquor (44 mL). Lose weight if told by your health care provider. Work with your dietitian to find an eating plan and weight loss strategies that work best for you. General information Talk to your health care provider and dietitian about taking daily supplements. You may be told the following depending on your health and the cause of your kidney stones: Not to take supplements with vitamin C. To take a calcium supplement. To take a daily probiotic supplement. To take other supplements such as magnesium, fish oil, or vitamin B6. Take over-the-counter and prescription medicines only as told by your health care provider. These include supplements. What foods should I limit? Limit your intake of the following foods, or eat them as told by your dietitian. Vegetables Spinach. Rhubarb. Beets. Canned vegetables. Pickles. Olives. Baked potatoes with skin. Grains Wheat bran. Baked goods. Salted crackers. Cereals high in sugar. Meats and other proteins Nuts. Nut butters. Large portions of meat, poultry, or fish. Salted, precooked, or cured meats, such as sausages, meat loaves, and hot dogs. Dairy Cheese. Beverages Regular soft drinks. Regular vegetable juice. Seasonings and condiments Seasoning blends with salt. Salad dressings. Soy sauce. Ketchup. Barbecue sauce. Other foods Canned soups. Canned pasta sauce. Casseroles. Pizza. Lasagna. Frozen meals. Potato chips. French fries. The items listed above may not be a complete list of foods and beverages you should limit. Contact a dietitian for more information. What foods should I avoid? Talk to your dietitian about specific foods you should avoid based on the type of kidney stones you have and your overall health. Fruits Grapefruit. The item listed above may not be a complete list of foods and beverages you should  avoid. Contact a dietitian for more information. Summary Kidney stones are deposits of minerals and salts that form inside your kidneys. You can lower your risk of kidney stones by making changes to your diet. The most important thing you can do is drink enough fluid. Drink enough fluid to keep your urine pale yellow. Talk to your dietitian about how much calcium you should have each day, and eat less salt and animal protein as told by your dietitian. This information is not intended to replace advice given to you by your health care provider. Make sure you discuss any questions you have with your health care provider. Document Revised: 01/08/2021 Document Reviewed: 01/08/2021 Elsevier Patient Education  2023 [employer]  

## 2021-10-04 ENCOUNTER — Encounter: Payer: Medicare Other | Admitting: Dermatology

## 2021-11-14 ENCOUNTER — Encounter: Payer: Self-pay | Admitting: Dermatology

## 2021-11-14 ENCOUNTER — Ambulatory Visit (INDEPENDENT_AMBULATORY_CARE_PROVIDER_SITE_OTHER): Payer: Medicare Other | Admitting: Dermatology

## 2021-11-14 DIAGNOSIS — D2371 Other benign neoplasm of skin of right lower limb, including hip: Secondary | ICD-10-CM

## 2021-11-14 DIAGNOSIS — Z1283 Encounter for screening for malignant neoplasm of skin: Secondary | ICD-10-CM

## 2021-11-14 DIAGNOSIS — D229 Melanocytic nevi, unspecified: Secondary | ICD-10-CM | POA: Diagnosis not present

## 2021-11-14 DIAGNOSIS — D235 Other benign neoplasm of skin of trunk: Secondary | ICD-10-CM

## 2021-11-14 DIAGNOSIS — L219 Seborrheic dermatitis, unspecified: Secondary | ICD-10-CM | POA: Diagnosis not present

## 2021-11-14 DIAGNOSIS — D1800 Hemangioma unspecified site: Secondary | ICD-10-CM

## 2021-11-14 DIAGNOSIS — L821 Other seborrheic keratosis: Secondary | ICD-10-CM

## 2021-11-14 DIAGNOSIS — D2372 Other benign neoplasm of skin of left lower limb, including hip: Secondary | ICD-10-CM

## 2021-11-14 DIAGNOSIS — L720 Epidermal cyst: Secondary | ICD-10-CM

## 2021-11-14 DIAGNOSIS — L723 Sebaceous cyst: Secondary | ICD-10-CM

## 2021-11-14 DIAGNOSIS — L814 Other melanin hyperpigmentation: Secondary | ICD-10-CM

## 2021-11-14 DIAGNOSIS — L578 Other skin changes due to chronic exposure to nonionizing radiation: Secondary | ICD-10-CM

## 2021-11-14 MED ORDER — MUPIROCIN 2 % EX OINT: 1.0000 | TOPICAL_OINTMENT | Freq: Two times a day (BID) | CUTANEOUS | 0 refills | Status: AC

## 2021-11-14 MED ORDER — DOXYCYCLINE MONOHYDRATE 100 MG PO CAPS: 100.0000 mg | ORAL_CAPSULE | Freq: Two times a day (BID) | ORAL | 0 refills | Status: AC

## 2021-11-14 MED ORDER — FLUOCINONIDE 0.05 % EX SOLN: CUTANEOUS | 3 refills | Status: AC

## 2021-11-14 NOTE — Patient Instructions (Addendum)
Doxycycline should be taken with food to prevent nausea. Do not lay down for 30 minutes after taking. Be cautious with sun exposure and use good sun protection while on this medication. Pregnant women should not take this medication.    Recommend taking Heliocare sun protection supplement daily in sunny weather for additional sun protection. For maximum protection on the sunniest days, you can take up to 2 capsules of regular Heliocare OR take 1 capsule of Heliocare Ultra. For prolonged exposure (such as a full day in the sun), you can repeat your dose of the supplement 4 hours after your first dose. Heliocare can be purchased at [employer], at some [facility] or at [email or web address].  Recommend taking Heliocare sun protection supplement daily in sunny weather for additional sun protection. For maximum protection on the sunniest days, you can take up to 2 capsules of regular Heliocare OR take 1 capsule of Heliocare Ultra. For prolonged exposure (such as a full day in the sun), you can repeat your dose of the supplement 4 hours after your first dose. Heliocare can be purchased at [employer], at some [facility] or at [email or web address].     Seborrheic Keratosis  What causes seborrheic keratoses? Seborrheic keratoses are harmless, common skin growths that first appear during adult life.  As time goes by, more growths appear.  Some people may develop a large number of them.  Seborrheic keratoses appear on both covered and uncovered body parts.  They are not caused by sunlight.  The tendency to develop seborrheic keratoses can be inherited.  They vary in color from skin-colored to gray, brown, or even black.  They can be either smooth or have a rough, warty surface.   Seborrheic keratoses are superficial and look as if they were stuck on the skin.  Under the microscope this type of keratosis looks like layers upon layers of skin.  That is why at times the top layer may seem to fall off, but the  rest of the growth remains and re-grows.    Treatment Seborrheic keratoses do not need to be treated, but can easily be removed in the office.  Seborrheic keratoses often cause symptoms when they rub on clothing or jewelry.  Lesions can be in the way of shaving.  If they become inflamed, they can cause itching, soreness, or burning.  Removal of a seborrheic keratosis can be accomplished by freezing, burning, or surgery. If any spot bleeds, scabs, or grows rapidly, please return to have it checked, as these can be an indication of a skin cancer.   Melanoma ABCDEs  Melanoma is the most dangerous type of skin cancer, and is the leading cause of death from skin disease.  You are more likely to develop melanoma if you: Have light-colored skin, light-colored eyes, or red or blond hair Spend a lot of time in the sun Tan regularly, either outdoors or in a tanning bed Have had blistering sunburns, especially during childhood Have a close family member who has had a melanoma Have atypical moles or large birthmarks  Early detection of melanoma is key since treatment is typically straightforward and cure rates are extremely high if we catch it early.   The first sign of melanoma is often a change in a mole or a new dark spot.  The ABCDE system is a way of remembering the signs of melanoma.  A for asymmetry:  The two halves do not match. B for border:  The edges of the growth are irregular.  C for color:  A mixture of colors are present instead of an even brown color. D for diameter:  Melanomas are usually (but not always) greater than 72m - the size of a pencil eraser. E for evolution:  The spot keeps changing in size, shape, and color.  Please check your skin once per month between visits. You can use a small mirror in front and a large mirror behind you to keep an eye on the back side or your body.   If you see any new or changing lesions before your next follow-up, please call to schedule a  visit.  Please continue daily skin protection including broad spectrum sunscreen SPF 30+ to sun-exposed areas, reapplying every 2 hours as needed when you're outdoors.   Staying in the shade or wearing long sleeves, sun glasses (UVA+UVB protection) and wide brim hats (4-inch brim around the entire circumference of the hat) are also recommended for sun protection.    Due to recent changes in healthcare laws, you may see results of your pathology and/or laboratory studies on MyChart before the doctors have had a chance to review them. We understand that in some cases there may be results that are confusing or concerning to you. Please understand that not all results are received at the same time and often the doctors may need to interpret multiple results in order to provide you with the best plan of care or course of treatment. Therefore, we ask that you please give u[place]2 business days to thoroughly review all your results before contacting the office for clarification. Should we see a critical lab result, you will be contacted sooner.   If You Need Anything After Your Visit  If you have any questions or concerns for your doctor, please call our main line at [phone] press option 4 to reach your doctor's medical assistant. If no one answers, please leave a voicemail as directed and we will return your call as soon as possible. Messages left after 4 pm will be answered the following business day.   You may also send [place] message via [place] We typically respond to MyChart messages within 1-2 business days.  For prescription refills, please ask your pharmacy to contact our office. Our fax number is [phone]  If you have an urgent issue when the clinic is closed that cannot wait until the next business day, you can page your doctor at the number below.    Please note that while we do our best to be available for urgent issues outside of office hours, we are not available 24/7.   If you  have an urgent issue and are unable to reach u[place] you may choose to seek medical care at your doctor's office, retail clinic, urgent care center, or emergency room.  If you have a medical emergency, please immediately call 911 or go to the emergency department.  Pager Numbers  - Dr. KNehemiah Massed [phone] - Dr. MLaurence Ferrari [phone] - Dr. SNicole Kindred [phone] In the event of inclement weather, please call our main line at [phone] an update on the status of any delays or closures.  Dermatology Medication Tips: Please keep the boxes that topical medications come in in order to help keep track of the instructions about where and how to use these. Pharmacies typically print the medication instructions only on the boxes and not directly on the medication tubes.   If your medication is too expensive, please contact our office at [phone]  4 or send [place] a message through [place].   We are unable to tell what your co-pay for medications will be in advance as this is different depending on your insurance coverage. However, we may be able to find a substitute medication at lower cost or fill out paperwork to get insurance to cover a needed medication.   If a prior authorization is required to get your medication covered by your insurance company, please allow [place] 1-2 business days to complete this process.  Drug prices often vary depending on where the prescription is filled and some pharmacies may offer cheaper prices.  The website www.goodrx.com contains coupons for medications through different pharmacies. The prices here do not account for what the cost may be with help from insurance (it may be cheaper with your insurance), but the website can give you the price if you did not use any insurance.  - You can print the associated coupon and take it with your prescription to the pharmacy.  - You may also stop by our office during regular business hours and pick up a GoodRx coupon  card.  - If you need your prescription sent electronically to a different pharmacy, notify our office through [facility] or by phone at [phone] option 4.     Si Usted Necesita Algo Despus de Su Visita  Tambin puede enviarnos un mensaje a travs de Pharmacist, community. Por lo general respondemos a los mensajes de MyChart en el transcurso de 1 a 2 das hbiles.  Para renovar recetas, por favor pida a su farmacia que se ponga en contacto con nuestra oficina. Harland Dingwall de fax es [place] [phone].  Si tiene un asunto urgente cuando la clnica est cerrada y que no puede esperar hasta el siguiente da hbil, puede llamar/localizar a su doctor(a) al nmero que aparece a continuacin.   Por favor, tenga en cuenta que aunque hacemos todo lo posible para estar disponibles para asuntos urgentes fuera del horario de [place], no estamos disponibles las 24 horas del da, los 7 das de la [place].   Si tiene un problema urgente y no puede comunicarse con nosotros, puede optar por buscar atencin mdica  en el consultorio de su doctor(a), en una clnica privada, en un centro de atencin urgente o en una sala de emergencias.  Si tiene Engineering geologist, por favor llame inmediatamente al 911 o vaya a la sala de emergencias.  Nmeros de bper  - Dr. Nehemiah Massed: [phone]  - Dra. Moye: [phone]  - Dra. Nicole Kindred: [phone]  En caso de inclemencias del [place], por favor llame a Johnsie Kindred principal al [phone] para una actualizacin sobre el [place] de cualquier retraso o cierre.  Consejos para la medicacin en dermatologa: Por favor, guarde las cajas en las que vienen los medicamentos de uso tpico para ayudarle a seguir las instrucciones sobre dnde y cmo usarlos. Las farmacias generalmente imprimen las instrucciones del medicamento slo en las cajas y no directamente en los tubos del [place].   Si su medicamento es muy caro, por favor, pngase en contacto con Zigmund Daniel llamando al [phone] y presione la opcin 4 o envenos un mensaje a travs de Pharmacist, community.   No podemos decirle cul ser su copago por los medicamentos por adelantado ya que esto es diferente dependiendo de la cobertura de su seguro. Sin embargo, es posible que podamos encontrar un medicamento sustituto a Electrical engineer un formulario para que el seguro cubra el medicamento que se considera necesario.  Si se requiere una autorizacin previa para que su compaa de seguros cubra su medicamento, por favor permtanos de 1 a 2 das hbiles para completar este proceso.  Los precios de los medicamentos varan con frecuencia dependiendo del lugar de dnde se surte la receta y alguna farmacias pueden ofrecer precios ms baratos.  El sitio web www.goodrx.com tiene cupones para medicamentos de diferentes farmacias. Los precios aqu no tienen en cuenta lo que podra costar con la ayuda del seguro (puede ser ms barato con su seguro), pero el sitio web puede darle el precio si no utiliz ningn seguro.  - Puede imprimir el cupn correspondiente y llevarlo con su receta a la farmacia.  - Tambin puede pasar por nuestra oficina durante el horario de atencin regular y recoger una tarjeta de cupones de GoodRx.  - Si necesita que su receta se enve electrnicamente a una farmacia diferente, informe a nuestra oficina a travs de MyChart de [place] o por telfono llamando al [phone] y presione la opcin 4.  

## 2021-11-14 NOTE — Progress Notes (Addendum)
Follow-Up Visit   Subjective  Marcia Blake is a 71 y.o. female who presents for the following: Annual Exam (1 year tbse , spot on at neck that has been a little tender and red, some spot at left back that she would like checked. ).  The patient presents for Total-Body Skin Exam (TBSE) for skin cancer screening and mole check.  The patient has spots, moles and lesions to be evaluated, some may be new or changing and the patient has concerns that these could be cancer.  The following portions of the chart were reviewed this encounter and updated as appropriate:  Tobacco  Allergies  Meds  Problems  Med Hx  Surg Hx  Fam Hx      Review of Systems: No other skin or systemic complaints except as noted in HPI or Assessment and Plan.   Objective  Well appearing patient in no apparent distress; mood and affect are within normal limits.  A full examination was performed including scalp, head, eyes, ears, nose, lips, neck, chest, axillae, abdomen, back, buttocks, bilateral upper extremities, bilateral lower extremities, hands, feet, fingers, toes, fingernails, and toenails. All findings within normal limits unless otherwise noted below.  Neck - Anterior 1.3 cm tender erythematous subcutaneous nodule   Scalp Scale and minimal erythema   Assessment & Plan  Inflamed epidermoid cyst of skin Neck - Anterior  Culture today R/o infection  I&D today   Discussed cyst can only be removed with surgery but drainage should shrink lesion, help clear infection and provide symptomatic relief.  Exam most consistent with an epidermal inclusion cyst. Discussed that a cyst is a benign growth that can grow over time and sometimes get irritated or inflamed. Recommend observation if it is not bothersome. Discussed option of surgical excision to remove it if it is growing, symptomatic, or other changes noted. Please call for new or changing lesions so they can be evaluated.    Incision and Drainage  - Neck - Anterior Location: neck   Informed Consent: Discussed risks (permanent scarring, light or dark discoloration, infection, pain, bleeding, bruising, redness, damage to adjacent structures, and recurrence of the lesion) and benefits of the procedure, as well as the alternatives.  Informed consent was obtained.  Preparation: The area was prepped with alcohol.  Anesthesia: Lidocaine 1% with epinephrine.   Procedure Details: An incision was made overlying the lesion. The lesion drained pus and thick, white material.   A large amount of fluid was drained.    Antibiotic ointment and a sterile pressure dressing were applied. The patient tolerated procedure well.  Total number of lesions drained: 1  Plan: The patient was instructed on post-op care. Recommend OTC analgesia as needed for pain.   doxycycline (MONODOX) 100 MG capsule - Neck - Anterior Take 1 capsule (100 mg total) by mouth 2 (two) times daily for 7 days. Take with food and drink. Doxycycline should be taken with food to prevent nausea. Do not lay down for 30 minutes after taking.  mupirocin ointment (BACTROBAN) 2 % - Neck - Anterior Apply 1 Application topically 2 (two) times daily. Apply to affect area at neck and cover with bandage until healed.  Related Procedures Anaerobic and Aerobic Culture  Seborrheic dermatitis Scalp  Chronic condition with duration or expected duration over one year. Currently well-controlled.   Seborrheic Dermatitis  -  is a chronic persistent rash characterized by pinkness and scaling most commonly of the mid face but also can occur on the scalp (dandruff),  ears; mid chest and mid back. It tends to be exacerbated by stress and cooler weather.  People who have neurologic disease may experience new onset or exacerbation of existing seborrheic dermatitis.  The condition is not curable but treatable and can be controlled.    Continue Fluocinonide 0.05% solution use daily as needed. Avoid  applying to face, groin, and axilla. Use as directed. Risk of skin atrophy with long-term use reviewed.   Topical steroids (such as triamcinolone, fluocinolone, fluocinonide, mometasone, clobetasol, halobetasol, betamethasone, hydrocortisone) can cause thinning and lightening of the skin if they are used for too long in the same area. Your physician has selected the right strength medicine for your problem and area affected on the body. Please use your medication only as directed by your physician to prevent side effects.    Related Medications fluocinonide (LIDEX) 0.05 % external solution Use daily as needed to affected areas on scalp, ears for dryness.  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  Recommend observation.  Discussed cryotherapy if spot(s) become irritated or inflamed. Avoid rubbing. Call if changing.  Lentigines - Scattered tan macules - Due to sun exposure - Benign-appearing, observe - Recommend daily broad spectrum sunscreen SPF 30+ to sun-exposed areas, reapply every 2 hours as needed. - Call for any changes  Seborrheic Keratoses - Stuck-on, waxy, tan-brown papules and/or plaques  - Benign-appearing - Discussed benign etiology and prognosis. - Observe - Call for any changes  Melanocytic Nevi - Tan-brown and/or pink-flesh-colored symmetric macules and papules - Benign appearing on exam today - Observation - Call clinic for new or changing moles - Recommend daily use of broad spectrum spf 30+ sunscreen to sun-exposed areas.   Dermatofibroma Right thigh ,left thigh , left upper back - Firm pink/brown papulenodule with dimple sign - Benign appearing - Call for any changes  Hemangiomas - Red papules - Discussed benign nature - Observe - Call for any changes  Actinic Damage - Chronic condition, secondary to  cumulative UV/sun exposure - diffuse scaly erythematous macules with underlying dyspigmentation - Recommend daily broad spectrum sunscreen SPF 30+ to sun-exposed areas, reapply every 2 hours as needed.  - Staying in the shade or wearing long sleeves, sun glasses (UVA+UVB protection) and wide brim hats (4-inch brim around the entire circumference of the hat) are also recommended for sun protection.  - Call for new or changing lesions.  Skin cancer screening performed today. Return for 1 - 2 year tbse . I, Ruthell Rummage, CMA, am acting as scribe for Forest Gleason, MD.  Documentation: I have reviewed the above documentation for accuracy and completeness, and I agree with the above.  Forest Gleason, MD

## 2021-11-19 ENCOUNTER — Encounter: Payer: Self-pay | Admitting: Oncology

## 2021-11-19 ENCOUNTER — Inpatient Hospital Stay: Payer: Medicare Other | Attending: Oncology

## 2021-11-19 ENCOUNTER — Inpatient Hospital Stay (HOSPITAL_BASED_OUTPATIENT_CLINIC_OR_DEPARTMENT_OTHER): Payer: Medicare Other | Admitting: Oncology

## 2021-11-19 ENCOUNTER — Other Ambulatory Visit: Payer: Self-pay

## 2021-11-19 VITALS — BP 127/82 | HR 80 | Temp 98.0°F | Resp 16 | Wt 175.6 lb

## 2021-11-19 DIAGNOSIS — Z9013 Acquired absence of bilateral breasts and nipples: Secondary | ICD-10-CM | POA: Insufficient documentation

## 2021-11-19 DIAGNOSIS — Z853 Personal history of malignant neoplasm of breast: Secondary | ICD-10-CM | POA: Insufficient documentation

## 2021-11-19 DIAGNOSIS — Z87891 Personal history of nicotine dependence: Secondary | ICD-10-CM | POA: Diagnosis not present

## 2021-11-19 DIAGNOSIS — E119 Type 2 diabetes mellitus without complications: Secondary | ICD-10-CM | POA: Diagnosis not present

## 2021-11-19 DIAGNOSIS — Z85020 Personal history of malignant carcinoid tumor of stomach: Secondary | ICD-10-CM

## 2021-11-19 DIAGNOSIS — D3A092 Benign carcinoid tumor of the stomach: Secondary | ICD-10-CM

## 2021-11-19 DIAGNOSIS — Z08 Encounter for follow-up examination after completed treatment for malignant neoplasm: Secondary | ICD-10-CM

## 2021-11-19 LAB — CBC WITH DIFFERENTIAL/PLATELET
Abs Immature Granulocytes: 0.02 10*3/uL (ref 0.00–0.07)
Basophils Absolute: 0 10*3/uL (ref 0.0–0.1)
Basophils Relative: 1 %
Eosinophils Absolute: 0.3 10*3/uL (ref 0.0–0.5)
Eosinophils Relative: 4 %
HCT: 37.3 % (ref 36.0–46.0)
Hemoglobin: 12.1 g/dL (ref 12.0–15.0)
Immature Granulocytes: 0 %
Lymphocytes Relative: 40 %
Lymphs Abs: 2.7 10*3/uL (ref 0.7–4.0)
MCH: 29.9 pg (ref 26.0–34.0)
MCHC: 32.4 g/dL (ref 30.0–36.0)
MCV: 92.1 fL (ref 80.0–100.0)
Monocytes Absolute: 0.6 10*3/uL (ref 0.1–1.0)
Monocytes Relative: 9 %
Neutro Abs: 3 10*3/uL (ref 1.7–7.7)
Neutrophils Relative %: 46 %
Platelets: 236 10*3/uL (ref 150–400)
RBC: 4.05 MIL/uL (ref 3.87–5.11)
RDW: 12 % (ref 11.5–15.5)
WBC: 6.7 10*3/uL (ref 4.0–10.5)
nRBC: 0 % (ref 0.0–0.2)

## 2021-11-19 LAB — COMPREHENSIVE METABOLIC PANEL
ALT: 15 U/L (ref 0–44)
AST: 23 U/L (ref 15–41)
Albumin: 4.1 g/dL (ref 3.5–5.0)
Alkaline Phosphatase: 60 U/L (ref 38–126)
Anion gap: 9 (ref 5–15)
BUN: 13 mg/dL (ref 8–23)
CO2: 28 mmol/L (ref 22–32)
Calcium: 9.2 mg/dL (ref 8.9–10.3)
Chloride: 97 mmol/L — ABNORMAL LOW (ref 98–111)
Creatinine, Ser: 0.7 mg/dL (ref 0.44–1.00)
GFR, Estimated: >60 mL/min (ref >60)
Glucose, Bld: 116 mg/dL — ABNORMAL HIGH (ref 70–99)
Potassium: 3.6 mmol/L (ref 3.5–5.1)
Sodium: 134 mmol/L — ABNORMAL LOW (ref 135–145)
Total Bilirubin: 0.7 mg/dL (ref 0.3–1.2)
Total Protein: 7.2 g/dL (ref 6.5–8.1)

## 2021-11-19 LAB — ANAEROBIC AND AEROBIC CULTURE: Result 1: NEGATIVE — AB

## 2021-11-20 ENCOUNTER — Other Ambulatory Visit: Payer: Self-pay | Admitting: Dermatology

## 2021-11-20 ENCOUNTER — Encounter: Payer: Self-pay | Admitting: Dermatology

## 2021-11-20 ENCOUNTER — Telehealth: Payer: Self-pay

## 2021-11-20 LAB — CHROMOGRANIN A: Chromogranin A (ng/mL): 172.7 ng/mL — ABNORMAL HIGH (ref 0.0–101.8)

## 2021-11-20 NOTE — Telephone Encounter (Signed)
Spoke with patient. She recently took prednisone (finished 3 weeks ago) and is diabetic, raising some concerns about risks with ciprofloxacin use. 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 TMP-SMX is contraindicated given sulfa allergy. Bacteria is resistant to penicillin.   Will recheck in clinic tomorrow to see if she needs repeat I&D and to confirm whether it still appears infected. If it is still infected, will review medications and consider Linezolid. Did discuss with patient this may require a prior authorization and would plan for repeat CBC in 1 week as well as avoidance of tyramine rich foods/beverages. 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

## 2021-11-20 NOTE — Telephone Encounter (Addendum)
Called patient and discussed culture results. Patient states she is still having some tenderness in area and feels like the cyst is beginning to fill back up with fluid. Discussed that may prescribe addition antibiotic and would discuss with Dr. Laurence Ferrari. Will return call to patient and let her know.  Sending to Dr. Laurence Ferrari to advise.     ----- Message from Alfonso Patten, MD sent at 11/20/2021 10:40 AM EDT ----- Culture grew two types of bacteria, one which responds to the antibiotic pill and one which is not covered by the antibiotic we prescribed.   If she is doing well, no additional treatment is needed (draining is the best treatment). However, if the area is still sore or draining pus, please let me know and we will treat with an additional antibiotic.  MAs please call. Thank you!

## 2021-11-20 NOTE — Progress Notes (Signed)
error 

## 2021-11-20 NOTE — Telephone Encounter (Signed)
I called patient to get more information on her "sulfur" allergy but got no answer. Sent her a Therapist, music. If she does not have a sulfa allergy, I would choose Bactrim for her treatment. However, if she has a sulfa allergy or if there is some question about whether she has a sulfa allergy, we will need to use a fluoroquinolone (would use ciprofloxacin). Will await her response.

## 2021-11-21 ENCOUNTER — Ambulatory Visit: Payer: Medicare Other | Admitting: Dermatology

## 2021-11-21 ENCOUNTER — Encounter: Payer: Self-pay | Admitting: Dermatology

## 2021-11-21 DIAGNOSIS — L723 Sebaceous cyst: Secondary | ICD-10-CM

## 2021-11-21 DIAGNOSIS — L089 Local infection of the skin and subcutaneous tissue, unspecified: Secondary | ICD-10-CM

## 2021-11-21 NOTE — Progress Notes (Signed)
   Follow-Up Visit   Subjective  Marcia Blake is a 71 y.o. female who presents for the following: Cyst (Anterior neck. 1 week recheck. Here to discuss C&S results. Area was more tender yesterday).  The following portions of the chart were reviewed this encounter and updated as appropriate:  Tobacco  Allergies  Meds  Problems  Med Hx  Surg Hx  Fam Hx      Review of Systems: No other skin or systemic complaints except as noted in HPI or Assessment and Plan.   Objective  Well appearing patient in no apparent distress; mood and affect are within normal limits.  A focused examination was performed including face, neck. Relevant physical exam findings are noted in the Assessment and Plan.  Neck - Anterior Erythematous indurated nodule   Assessment & Plan  Inflamed epidermoid cyst of skin Neck - Anterior  Culture grew finegoldia magna (possible contaminant) and staph epidermidis. Today does not appear clearly infected. Will repeat culture and monitor for worsening after repeat I&D.  Incision and Drainage - Neck - Anterior Location: anterior neck  Informed Consent: Discussed risks (permanent scarring, light or dark discoloration, infection, pain, bleeding, bruising, redness, damage to adjacent structures, and recurrence of the lesion) and benefits of the procedure, as well as the alternatives.  Informed consent was obtained.  Preparation: The area was prepped with alcohol.  Anesthesia: Lidocaine 1% with epinephrine  Procedure Details: An incision was made overlying the lesion. The lesion drained cheesy cyst-like material.  Pieces of cyst wall were extracted.    Antibiotic ointment and a sterile pressure dressing were applied. The patient tolerated procedure well.  Total number of lesions drained: 1  Plan: The patient was instructed on post-op care. Recommend OTC analgesia as needed for pain.   Related Procedures Anaerobic and Aerobic Culture  mupirocin ointment  (BACTROBAN) 2 % Apply 1 Application topically 2 (two) times daily. Apply to affect area at neck and cover with bandage until healed.   Return if symptoms worsen or fail to improve.  I, Emelia Salisbury, CMA, am acting as scribe for Forest Gleason, MD.  Documentation: I have reviewed the above documentation for accuracy and completeness, and I agree with the above.  Forest Gleason, MD

## 2021-11-21 NOTE — Patient Instructions (Signed)
Wound Care Instructions  Cleanse wound gently with soap and water once a day then pat dry with clean gauze. Apply a thing coat of Petrolatum (petroleum jelly, "Vaseline") over the wound (unless you have an allergy to this). We recommend that you use a new, sterile tube of Vaseline. Do not pick or remove scabs. Do not remove the yellow or white "healing tissue" from the base of the wound.  Cover the wound with fresh, clean, nonstick gauze and secure with paper tape. You may use Band-Aids in place of gauze and tape if the would is small enough, but would recommend trimming much of the tape off as there is often too much. Sometimes Band-Aids can irritate the skin.  You should call the office for your biopsy report after 1 week if you have not already been contacted.  If you experience any problems, such as abnormal amounts of bleeding, swelling, significant bruising, significant pain, or evidence of infection, please call the office immediately.  FOR ADULT SURGERY PATIENTS: If you need something for pain relief you may take 1 extra strength Tylenol (acetaminophen) AND 2 Ibuprofen (200mg each) together every 4 hours as needed for pain. (do not take these if you are allergic to them or if you have a reason you should not take them.) Typically, you may only need pain medication for 1 to 3 days.    Due to recent changes in healthcare laws, you may see results of your pathology and/or laboratory studies on MyChart before the doctors have had a chance to review them. We understand that in some cases there may be results that are confusing or concerning to you. Please understand that not all results are received at the same time and often the doctors may need to interpret multiple results in order to provide you with the best plan of care or course of treatment. Therefore, we ask that you please give us 2 business days to thoroughly review all your results before contacting the office for clarification. Should we  see a critical lab result, you will be contacted sooner.   If You Need Anything After Your Visit  If you have any questions or concerns for your doctor, please call our main line at [phone] and press option 4 to reach your doctor's medical assistant. If no one answers, please leave a voicemail as directed and we will return your call as soon as possible. Messages left after 4 pm will be answered the following business day.   You may also send us a message via MyChart. We typically respond to MyChart messages within 1-2 business days.  For prescription refills, please ask your pharmacy to contact our office. Our fax number is [phone].  If you have an urgent issue when the clinic is closed that cannot wait until the next business day, you can page your doctor at the number below.    Please note that while we do our best to be available for urgent issues outside of office hours, we are not available 24/7.   If you have an urgent issue and are unable to reach us, you may choose to seek medical care at your doctor's office, retail clinic, urgent care center, or emergency room.  If you have a medical emergency, please immediately call 911 or go to the emergency department.  Pager Numbers  - Dr. Kowalski: [phone]  - Dr. Moye: [phone]  - Dr. Stewart: [phone]  In the event of inclement weather, please call our main line at [phone]   for an update on the status of any delays or closures.  Dermatology Medication Tips: Please keep the boxes that topical medications come in in order to help keep track of the instructions about where and how to use these. Pharmacies typically print the medication instructions only on the boxes and not directly on the medication tubes.   If your medication is too expensive, please contact our office at [phone] option 4 or send us a message through MyChart.   We are unable to tell what your co-pay for medications will be in advance  as this is different depending on your insurance coverage. However, we may be able to find a substitute medication at lower cost or fill out paperwork to get insurance to cover a needed medication.   If a prior authorization is required to get your medication covered by your insurance company, please allow us 1-2 business days to complete this process.  Drug prices often vary depending on where the prescription is filled and some pharmacies may offer cheaper prices.  The website www.goodrx.com contains coupons for medications through different pharmacies. The prices here do not account for what the cost may be with help from insurance (it may be cheaper with your insurance), but the website can give you the price if you did not use any insurance.  - You can print the associated coupon and take it with your prescription to the pharmacy.  - You may also stop by our office during regular business hours and pick up a GoodRx coupon card.  - If you need your prescription sent electronically to a different pharmacy, notify our office through [place] MyChart or by phone at [phone] option 4.     Si Usted Necesita Algo Despus de Su Visita  Tambin puede enviarnos un mensaje a travs de MyChart. Por lo general respondemos a los mensajes de MyChart en el transcurso de 1 a 2 das hbiles.  Para renovar recetas, por favor pida a su farmacia que se ponga en contacto con nuestra oficina. Nuestro nmero de fax es el [phone].  Si tiene un asunto urgente cuando la clnica est cerrada y que no puede esperar hasta el siguiente da hbil, puede llamar/localizar a su doctor(a) al nmero que aparece a continuacin.   Por favor, tenga en cuenta que aunque hacemos todo lo posible para estar disponibles para asuntos urgentes fuera del horario de oficina, no estamos disponibles las 24 horas del da, los 7 das de la semana.   Si tiene un problema urgente y no puede comunicarse con nosotros, puede optar  por buscar atencin mdica  en el consultorio de su doctor(a), en una clnica privada, en un centro de atencin urgente o en una sala de emergencias.  Si tiene una emergencia mdica, por favor llame inmediatamente al 911 o vaya a la sala de emergencias.  Nmeros de bper  - Dr. Kowalski: [phone]  - Dra. Moye: [phone]  - Dra. Stewart: [phone]  En caso de inclemencias del tiempo, por favor llame a nuestra lnea principal al [phone] para una actualizacin sobre el estado de cualquier retraso o cierre.  Consejos para la medicacin en dermatologa: Por favor, guarde las cajas en las que vienen los medicamentos de uso tpico para ayudarle a seguir las instrucciones sobre dnde y cmo usarlos. Las farmacias generalmente imprimen las instrucciones del medicamento slo en las cajas y no directamente en los tubos del medicamento.   Si su medicamento es muy caro, por favor, pngase en contacto con nuestra   oficina llamando al [phone] y presione la opcin 4 o envenos un mensaje a travs de MyChart.   No podemos decirle cul ser su copago por los medicamentos por adelantado ya que esto es diferente dependiendo de la cobertura de su seguro. Sin embargo, es posible que podamos encontrar un medicamento sustituto a menor costo o llenar un formulario para que el seguro cubra el medicamento que se considera necesario.   Si se requiere una autorizacin previa para que su compaa de seguros cubra su medicamento, por favor permtanos de 1 a 2 das hbiles para completar este proceso.  Los precios de los medicamentos varan con frecuencia dependiendo del lugar de dnde se surte la receta y alguna farmacias pueden ofrecer precios ms baratos.  El sitio web www.goodrx.com tiene cupones para medicamentos de diferentes farmacias. Los precios aqu no tienen en cuenta lo que podra costar con la ayuda del seguro (puede ser ms barato con su seguro), pero el sitio web puede darle el precio si  no utiliz ningn seguro.  - Puede imprimir el cupn correspondiente y llevarlo con su receta a la farmacia.  - Tambin puede pasar por nuestra oficina durante el horario de atencin regular y recoger una tarjeta de cupones de GoodRx.  - Si necesita que su receta se enve electrnicamente a una farmacia diferente, informe a nuestra oficina a travs de MyChart de [place] o por telfono llamando al [phone] y presione la opcin 4.  

## 2021-11-25 NOTE — Progress Notes (Signed)
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  This was followed by a right breast lumpectomy 6 weeks of radiation treatment and 5 years of tamoxifen.  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  She underwent bilateral mastectomy at that time.  Left breast mastectomy showed sclerosing fibroadenoma with calcifications 0.8 cm completely excised.  No evidence of malignancy.  Right breast mastectomy showed no residual lesion pTis NX.  She did not require adjuvant hormone therapy after bilateral mastectomy.   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  She has been getting chromogranin A levels monitored which have been chronically elevated at 123 and 151 in the past.  Colonoscopy was negative and endoscopy report showed mild inflammatory changes at anastomosis which was widely patent and performed back in 2015.    Interval history- she is doing well for her age. Denies any specific complaints at this timr. Denies abdominal pain, nausea, vomiting  ECOG PS- 1 Pain scale- 0   Review of systems- Review of Systems  Constitutional:  Negative for chills, fever, malaise/fatigue and weight loss.  HENT:  Negative for  congestion, ear discharge and nosebleeds.   Eyes:  Negative for blurred vision.  Respiratory:  Negative for cough, hemoptysis, sputum production, shortness of breath and wheezing.   Cardiovascular:  Negative for chest pain, palpitations, orthopnea and claudication.  Gastrointestinal:  Negative for abdominal pain, blood in stool, constipation, diarrhea, heartburn, melena, nausea and vomiting.  Genitourinary:  Negative for dysuria, flank pain, frequency, hematuria and urgency.  Musculoskeletal:  Negative for back pain, joint pain and myalgias.  Skin:  Negative for rash.  Neurological:  Negative for dizziness, tingling, focal weakness, seizures, weakness and headaches.  Endo/Heme/Allergies:  Does not bruise/bleed easily.  Psychiatric/Behavioral:  Negative for depression and suicidal ideas. The patient does not have insomnia.       Allergies  Allergen Reactions   Elemental Sulfur Rash   Penicillins Rash     Past Medical History:  Diagnosis Date   Cancer ([place]) [id number]   breast-rt.   Diabetes mellitus without complication ([place])    Kidney stone      Past Surgical History:  Procedure Laterality Date   bilateral mastectomies Bilateral    BREAST SURGERY     COLONOSCOPY WITH PROPOFOL N/A 02/04/2019   Procedure: COLONOSCOPY WITH PROPOFOL;  Surgeon: Lin Landsman, MD;  Location: [facility] ENDOSCOPY;  Service: Gastroenterology;  Laterality: N/A;   [place]OMAN RT BREAST ([place] HX) Right     Social History   Socioeconomic History   Marital status: Divorced    Spouse name: Not on file   Number of children: Not on file   Years of education: Not on file   Highest education level: Not on file  Occupational History   Not on file  Tobacco Use   Smoking status: Former    Types: Cigarettes    Quit date: 1970    Years since quitting: 53.5   Smokeless tobacco: Never  Vaping Use   Vaping Use: Never used  Substance and Sexual Activity   Alcohol use: Yes    Comment: socially   Drug use:  Never   Sexual activity: Yes    Birth control/protection: Post-menopausal  Other Topics Concern   Not on file  Social History Narrative   Not on file   Social Determinants of Health   Financial Resource Strain: Not on file  Food Insecurity: Not on file  Transportation Needs: Not on file  Physical Activity: Not on file  Stress: Not on file  Social Connections: Not on file  Intimate Partner Violence: Not on file    Family History  Problem Relation Age of Onset   Diabetes Sister      Current Outpatient Medications:    acetaminophen (TYLENOL) 500 MG tablet, Take 500 mg by mouth every 6 (six) hours as needed., Disp: , Rfl:    aspirin 81 MG EC tablet, Take 81 mg by mouth daily., Disp: , Rfl:    atorvastatin (LIPITOR) 20 MG tablet, Take 20 mg by mouth daily., Disp: , Rfl:    Biotin 5 MG TABS, Take 5 mg by mouth daily., Disp: , Rfl:    fluocinonide (LIDEX) 0.05 % external solution, Use daily as needed to affected areas on scalp, ears for dryness., Disp: 180 mL, Rfl: 3   Multiple Vitamin (MULTIVITAMIN) capsule, Take 1 capsule by mouth daily., Disp: , Rfl:    mupirocin ointment (BACTROBAN) 2 %, Apply 1 Application topically 2 (two) times daily. Apply to affect area at neck and cover with bandage until healed., Disp: 22 g, Rfl: 0  Physical exam:  Vitals:   11/19/21 1415  BP: 127/82  Pulse: 80  Resp: 16  Temp: 98 F (36.7 C)  Weight: 175 lb 9.6 oz (79.7 kg)   Physical Exam Constitutional:      General: She is not in acute distress. Cardiovascular:     Rate and Rhythm: Normal rate and regular rhythm.     Heart sounds: Normal heart sounds.  Pulmonary:     Effort: Pulmonary effort is normal.     Breath sounds: Normal breath sounds.  Skin:    General: Skin is warm and dry.  Neurological:     Mental Status: She is alert and oriented to person, place, and time.         Latest Ref Rng & Units 11/19/2021    2:04 PM  CMP  Glucose 70 - 99 mg/dL 116   BUN 8 - 23 mg/dL 13    Creatinine 0.44 - 1.00 mg/dL 0.70   Sodium 135 - 145 mmol/L 134   Potassium 3.5 - 5.1 mmol/L 3.6   Chloride 98 - 111 mmol/L 97   CO2 22 - 32 mmol/L 28   Calcium 8.9 - 10.3 mg/dL 9.2   Total Protein 6.5 - 8.1 g/dL 7.2   Total Bilirubin 0.3 - 1.2 mg/dL 0.7   Alkaline Phos 38 - 126 U/L 60   AST 15 - 41 U/L 23   ALT 0 - 44 U/L 15       Latest Ref Rng & Units 11/19/2021    2:04 PM  CBC  WBC 4.0 - 10.5 K/uL 6.7   Hemoglobin 12.0 - 15.0 g/dL 12.1  Hematocrit 36.0 - 46.0 % 37.3   Platelets 150 - 400 K/uL 236     Assessment and plan- Patient is a 71 y.o. female who is here for following issues:  H/o breast cancer-s/p b/l mastectomy and currently at 5 years of surveillance. No imaging/ labs needed from breast ca standpoint  2. H/o gastric carcinoid- >12 years out from diagnosis. She does not require surveillance labs but patient insists on yearly labs. I will see her in a year   Visit Diagnosis 1. Encounter for follow-up surveillance of breast cancer   2. Benign carcinoid tumor of stomach      Dr. Randa Evens, MD, MPH [facility] at [facility] [phone] 11/25/2021 3:38 PM

## 2021-11-29 ENCOUNTER — Telehealth: Payer: Self-pay

## 2021-11-29 ENCOUNTER — Encounter: Payer: Self-pay | Admitting: Dermatology

## 2021-11-29 LAB — ANAEROBIC AND AEROBIC CULTURE

## 2021-11-29 NOTE — Telephone Encounter (Signed)
-----   Message from [place], MD sent at 11/29/2021 10:18 AM EDT ----- Repeat culture only grew cutibacterium acnes, a common skin bacteria that does not cause significant infection.   If she is doing well, no additional treatment needed.   MAs please call. Thank you!

## 2021-11-29 NOTE — Telephone Encounter (Signed)
Patient advised repeat culture showed common skin bacteria, patient doing well. Lurlean Horns., RMA

## 2022-03-01 ENCOUNTER — Other Ambulatory Visit: Payer: Self-pay | Admitting: Infectious Diseases

## 2022-03-01 DIAGNOSIS — R1011 Right upper quadrant pain: Secondary | ICD-10-CM

## 2022-03-07 ENCOUNTER — Ambulatory Visit
Admission: RE | Admit: 2022-03-07 | Discharge: 2022-03-07 | Disposition: A | Discharge status: Home / Self Care | Payer: Medicare Other | Source: Ambulatory Visit | Attending: Infectious Diseases | Admitting: Infectious Diseases

## 2022-03-07 DIAGNOSIS — R1011 Right upper quadrant pain: Secondary | ICD-10-CM

## 2022-03-08 ENCOUNTER — Encounter: Payer: Self-pay | Admitting: Dermatology

## 2022-03-25 ENCOUNTER — Telehealth: Payer: Self-pay

## 2022-03-25 NOTE — Telephone Encounter (Signed)
Patient contacted office to inquire about scheduling her next colonoscopy.  She stated that her mychart shows repeat 7 to 10 years.  Her last colonoscopy was performed with Dr.Vanga 02/04/19.  Some polyps were present.  Patient also has a family history of colon cancer (mother).  I informed patient that I would ask you to review her colonoscopy report and re-advise as to when her colonoscopy should be repeated.  Thanks,  [employer]

## 2022-03-26 NOTE — Telephone Encounter (Signed)
Recommend 5 years, drafted a letter  RV

## 2022-03-27 NOTE — Telephone Encounter (Signed)
Patient has been advised that she should have her repeat colonoscopy in 2025.  Letter printed to mail to patient for her records.  Thanks,  [place], [facility]

## 2022-05-22 ENCOUNTER — Inpatient Hospital Stay: Payer: Medicare Other | Attending: Oncology

## 2022-05-22 ENCOUNTER — Other Ambulatory Visit: Payer: Medicare Other

## 2022-05-22 DIAGNOSIS — Z08 Encounter for follow-up examination after completed treatment for malignant neoplasm: Secondary | ICD-10-CM

## 2022-05-22 DIAGNOSIS — D3A092 Benign carcinoid tumor of the stomach: Secondary | ICD-10-CM | POA: Diagnosis present

## 2022-05-22 DIAGNOSIS — Z853 Personal history of malignant neoplasm of breast: Secondary | ICD-10-CM | POA: Insufficient documentation

## 2022-05-22 LAB — COMPREHENSIVE METABOLIC PANEL
ALT: 16 U/L (ref 0–44)
AST: 18 U/L (ref 15–41)
Albumin: 4.2 g/dL (ref 3.5–5.0)
Alkaline Phosphatase: 82 U/L (ref 38–126)
Anion gap: 8 (ref 5–15)
BUN: 20 mg/dL (ref 8–23)
CO2: 30 mmol/L (ref 22–32)
Calcium: 9.9 mg/dL (ref 8.9–10.3)
Chloride: 100 mmol/L (ref 98–111)
Creatinine, Ser: 0.87 mg/dL (ref 0.44–1.00)
GFR, Estimated: >60 mL/min (ref >60)
Glucose, Bld: 126 mg/dL — ABNORMAL HIGH (ref 70–99)
Potassium: 3.9 mmol/L (ref 3.5–5.1)
Sodium: 138 mmol/L (ref 135–145)
Total Bilirubin: 0.5 mg/dL (ref 0.3–1.2)
Total Protein: 7.9 g/dL (ref 6.5–8.1)

## 2022-05-27 LAB — CHROMOGRANIN A: Chromogranin A (ng/mL): 205.4 ng/mL — ABNORMAL HIGH (ref 0.0–101.8)

## 2022-05-31 ENCOUNTER — Encounter: Payer: Self-pay | Admitting: Urology

## 2022-05-31 ENCOUNTER — Encounter: Payer: Self-pay | Admitting: *Deleted

## 2022-08-22 ENCOUNTER — Telehealth: Payer: Self-pay | Admitting: Gastroenterology

## 2022-08-22 NOTE — Telephone Encounter (Signed)
Patient will need a referral from PCP because we have never seen patient in the office just for a colonoscopy. Called patient and informed her that she needed a referral for PCP since she has never seen [place] in the office. She verbalized understanding and states she will get her PCP to placed a referral

## 2022-08-22 NOTE — Telephone Encounter (Signed)
This patient left a voicemail on my phone about scheduling an appointment, it shows she was transferred to my phone.  I have called patient to confirm that she did not call regarding colonoscopy but she stated that she told the person on the phone she wanted an appointment with Dr Allegra Lai.  Colonoscopy was done 02/04/2019 and not due until 02/04/2024.  Please call patient back.

## 2022-09-02 ENCOUNTER — Ambulatory Visit: Payer: Medicare Other | Admitting: Urology

## 2022-09-05 ENCOUNTER — Other Ambulatory Visit: Payer: Self-pay | Admitting: Infectious Diseases

## 2022-09-05 DIAGNOSIS — R1011 Right upper quadrant pain: Secondary | ICD-10-CM

## 2022-09-05 DIAGNOSIS — D3A010 Benign carcinoid tumor of the duodenum: Secondary | ICD-10-CM

## 2022-09-23 ENCOUNTER — Other Ambulatory Visit: Payer: Medicare Other

## 2022-09-25 ENCOUNTER — Ambulatory Visit: Payer: Medicare Other | Admitting: Urology

## 2022-09-25 ENCOUNTER — Ambulatory Visit
Admission: RE | Admit: 2022-09-25 | Discharge: 2022-09-25 | Disposition: A | Discharge status: Home / Self Care | Payer: Medicare Other | Source: Ambulatory Visit | Attending: Infectious Diseases | Admitting: Infectious Diseases

## 2022-09-25 DIAGNOSIS — D3A010 Benign carcinoid tumor of the duodenum: Secondary | ICD-10-CM | POA: Insufficient documentation

## 2022-09-25 DIAGNOSIS — R1011 Right upper quadrant pain: Secondary | ICD-10-CM | POA: Diagnosis present

## 2022-09-25 MED ORDER — IOHEXOL 300 MG/ML  SOLN
100.0000 mL | Freq: Once | INTRAMUSCULAR | Status: AC | PRN
  Administered 2022-09-25: 100 mL via INTRAVENOUS

## 2022-09-26 ENCOUNTER — Encounter: Payer: Self-pay | Admitting: Urology

## 2022-09-26 ENCOUNTER — Ambulatory Visit: Payer: Medicare Other | Admitting: Urology

## 2022-09-26 VITALS — BP 129/81 | HR 82 | Ht 62.5 in | Wt 171.0 lb

## 2022-09-26 DIAGNOSIS — Z87442 Personal history of urinary calculi: Secondary | ICD-10-CM | POA: Diagnosis not present

## 2022-09-26 DIAGNOSIS — R300 Dysuria: Secondary | ICD-10-CM

## 2022-09-26 DIAGNOSIS — N200 Calculus of kidney: Secondary | ICD-10-CM

## 2022-09-26 NOTE — Progress Notes (Signed)
   09/26/2022 9:45 AM   Marcia Blake [birth year] [id number]  Reason for visit: Follow up nephrolithiasis, urinary symptoms  HPI: 71 year old female with history of neuroendocrine tumor status post bowel resection who has had 3 prior stone episodes.  1 of these required shockwave lithotripsy in 2018 at [facility], and stone type was calcium oxalate.    She denies any stone episodes over the last year.  She has had some vague lower abdominal pain and PCP ordered a CT that was completed yesterday.  I personally viewed and interpreted those images that show no evidence of stones, no hydronephrosis, formal radiology read still pending regarding additional pathology.  We discussed general stone prevention strategies including adequate hydration with goal of producing 2.5 L of urine daily, increasing citric acid intake, increasing calcium intake during high oxalate meals, minimizing animal protein, and decreasing salt intake. Information about dietary recommendations given today.   She also reports about a day of some intermittent very mild discomfort with urination.  I offered urinalysis today to evaluate for UTI, but she does not think she can void.  If her symptoms worsen I recommended checking a UA.  RTC 1 year KUB prior   Sondra Come, MD  [facility] [address] [place], [place] [address] [phone]

## 2022-10-14 ENCOUNTER — Encounter: Payer: Self-pay | Admitting: Gastroenterology

## 2022-10-14 ENCOUNTER — Other Ambulatory Visit: Payer: Self-pay

## 2022-10-14 ENCOUNTER — Ambulatory Visit (INDEPENDENT_AMBULATORY_CARE_PROVIDER_SITE_OTHER): Payer: Medicare Other | Admitting: Gastroenterology

## 2022-10-14 VITALS — BP 127/78 | HR 89 | Temp 98.5°F | Ht 62.5 in | Wt 174.5 lb

## 2022-10-14 DIAGNOSIS — R1011 Right upper quadrant pain: Secondary | ICD-10-CM | POA: Diagnosis not present

## 2022-10-14 DIAGNOSIS — R1013 Epigastric pain: Secondary | ICD-10-CM | POA: Diagnosis not present

## 2022-10-14 NOTE — Progress Notes (Signed)
Arlyss Repress, MD [address]  [address]  [place], [place] [address]  Main: [phone]  Fax: [phone]    Gastroenterology Consultation  Referring Provider:     Mick Sell, MD Primary Care Physician:  Mick Sell, MD Primary Gastroenterologist:  Dr. Arlyss Repress Reason for Consultation: Right upper quadrant and epigastric discomfort        HPI:   Marcia Blake is a 72 y.o. female referred by Dr. Sampson Goon, Stann Mainland, MD  for consultation & management of right upper quadrant and epigastric discomfort.  Patient had history of gastric carcinoid in 2011 s/p resection, under surveillance, in remission.  Patient reports having intermittent episodes of epigastric discomfort as well as tenderness in the right upper quadrant which is usually worse postprandial.  She had a right upper quadrant ultrasound in 02/2022 which revealed possible gallstone.  She also underwent CT abdomen pelvis with contrast on 09/28/2022 secondary to right upper quadrant discomfort which did not reveal any acute intra-abdominal pathology.  Patient denies any heartburn, she used to take Zantac in the past.  Currently takes baking soda at night as needed.  NSAIDs: None  Antiplts/Anticoagulants/Anti thrombotics: None  GI Procedures: Colonoscopy 02/04/2019 Pancolonic diverticulosis 4 subcentimeter polyps in the colon, resected DIAGNOSIS:  A. COLON POLYPS X2, ASCENDING AND TRANSVERSE; COLD SNARE:  - SINGLE FRAGMENT WITH MINUTE TUBULAR ADENOMA.  - FRAGMENTS (X2) WITH BENIGN COLONIC MUCOSA SHOWING SUPERFICIAL REACTIVE  CHANGES.  - NEGATIVE FOR HIGH-GRADE DYSPLASIA AND MALIGNANCY.   B. RECTAL POLYPS X2; COLD BIOPSY:  - SINGLE FRAGMENT OF HYPERPLASTIC POLYP.  - NEGATIVE FOR DYSPLASIA AND MALIGNANCY.   Past Medical History:  Diagnosis Date   Cancer [facility]) [phone]   breast-rt.   Diabetes mellitus without complication ([facility])    Kidney stone     Past Surgical History:  Procedure  Laterality Date   bilateral mastectomies Bilateral    BREAST SURGERY     COLONOSCOPY WITH PROPOFOL N/A 02/04/2019   Procedure: COLONOSCOPY WITH PROPOFOL;  Surgeon: Toney Reil, MD;  Location[facility] ENDOSCOPY;  Service: Gastroenterology;  Laterality: N/A;   [place] KOMAN RT BREAST ([facility] HX) Right      Current Outpatient Medications:    acetaminophen (TYLENOL) 500 MG tablet, Take 500 mg by mouth every 6 (six) hours as needed., Disp: , Rfl:    aspirin 81 MG EC tablet, Take 81 mg by mouth daily., Disp: , Rfl:    atorvastatin (LIPITOR) 20 MG tablet, Take 20 mg by mouth daily., Disp: , Rfl:    fluocinonide (LIDEX) 0.05 % external solution, Use daily as needed to affected areas on scalp, ears for dryness., Disp: 180 mL, Rfl: 3   Multiple Vitamin (MULTIVITAMIN) capsule, Take 1 capsule by mouth daily., Disp: , Rfl:    mupirocin ointment (BACTROBAN) 2 %, Apply 1 Application topically 2 (two) times daily. Apply to affect area at neck and cover with bandage until healed., Disp: 22 g, Rfl: 0   Family History  Problem Relation Age of Onset   Diabetes Sister      Social History   Tobacco Use   Smoking status: Former    Types: Cigarettes    Quit date: 1970    Years since quitting: 54.4    Passive exposure: Past   Smokeless tobacco: Never  Vaping Use   Vaping Use: Never used  Substance Use Topics   Alcohol use: Yes    Comment: socially   Drug use: Never    Allergies  as of 10/14/2022 - Review Complete 10/14/2022  Allergen Reaction Noted   Elemental sulfur Rash 04/13/2017   Penicillins Rash 04/13/2017    Review of Systems:    All systems reviewed and negative except where noted in HPI.   Physical Exam:  BP 127/78 (BP Location: Left Arm, Patient Position: Sitting, Cuff Size: Normal)   Pulse 89   Temp 98.5 F (36.9 C) (Oral)   Ht 5' 2.5" (1.588 m)   Wt 174 lb 8 oz (79.2 kg)   BMI 31.41 kg/m  No LMP recorded. Patient has had a hysterectomy.  General:   Alert,  Well-developed,  well-nourished, pleasant and cooperative in NAD Head:  Normocephalic and atraumatic. Eyes:  Sclera clear, no icterus.   Conjunctiva pink. Ears:  Normal auditory acuity. Nose:  No deformity, discharge, or lesions. Mouth:  No deformity or lesions,oropharynx pink & moist. Neck:  Supple; no masses or thyromegaly. Lungs:  Respirations even and unlabored.  Clear throughout to auscultation.   No wheezes, crackles, or rhonchi. No acute distress. Heart:  Regular rate and rhythm; no murmurs, clicks, rubs, or gallops. Abdomen:  Normal bowel sounds. Soft, mild right upper quadrant tenderness and non-distended without masses, hepatosplenomegaly or hernias noted.  No guarding or rebound tenderness.   Rectal: Not performed Msk:  Symmetrical without gross deformities. Good, equal movement & strength bilaterally. Pulses:  Normal pulses noted. Extremities:  No clubbing or edema.  No cyanosis. Neurologic:  Alert and oriented x3;  grossly normal neurologically. Skin:  Intact without significant lesions or rashes. No jaundice. Psych:  Alert and cooperative. Normal mood and affect.  Imaging Studies: Reviewed  Assessment and Plan:   [name] is a 72 y.o. female with history of gastric carcinoid s/p resection, under active surveillance is seen in consultation for epigastric/right upper quadrant discomfort.  Recommend EGD and right upper quadrant ultrasound for further evaluation  Follow up based on the above workup   Arlyss Repress, MD

## 2022-10-14 NOTE — Patient Instructions (Signed)
Your Ultrasound is schedule for 10/18/2022 arrive at 8:45am for a 9:00am scan at the [facility]. Nothing to eat or drink after midnight. If you need to reschedule please call [phone] option 3 and then option 2.

## 2022-10-16 ENCOUNTER — Ambulatory Visit
Admission: RE | Admit: 2022-10-16 | Discharge: 2022-10-16 | Disposition: A | Discharge status: Home / Self Care | Payer: Medicare Other | Source: Ambulatory Visit | Attending: Gastroenterology | Admitting: Gastroenterology

## 2022-10-16 DIAGNOSIS — R1011 Right upper quadrant pain: Secondary | ICD-10-CM | POA: Diagnosis present

## 2022-10-18 ENCOUNTER — Ambulatory Visit: Payer: Medicare Other

## 2022-10-21 ENCOUNTER — Telehealth: Payer: Self-pay

## 2022-10-21 NOTE — Telephone Encounter (Signed)
-----   Message from Toney Reil, MD sent at 10/21/2022  2:35 PM EDT ----- Please inform patient that the ultrasound of her gallbladder showed gallstones as well as fatty liver.  If upper endoscopy is unremarkable, I will refer her to general surgery to evaluate for removal of gallbladder  RV

## 2022-10-21 NOTE — Telephone Encounter (Signed)
Patient verbalized understanding of results  

## 2022-10-23 ENCOUNTER — Encounter: Payer: Self-pay | Admitting: Gastroenterology

## 2022-10-24 ENCOUNTER — Ambulatory Visit: Payer: Medicare Other | Admitting: Certified Registered"

## 2022-10-24 ENCOUNTER — Ambulatory Visit
Admission: RE | Admit: 2022-10-24 | Discharge: 2022-10-24 | Disposition: A | Discharge status: Home / Self Care | Payer: Medicare Other | Attending: Gastroenterology | Admitting: Gastroenterology

## 2022-10-24 ENCOUNTER — Encounter: Payer: Self-pay | Admitting: Gastroenterology

## 2022-10-24 ENCOUNTER — Other Ambulatory Visit: Payer: Self-pay

## 2022-10-24 ENCOUNTER — Encounter
Admission: RE | Disposition: A | Discharge status: Home / Self Care | Payer: Self-pay | Source: Home / Self Care | Attending: Gastroenterology

## 2022-10-24 DIAGNOSIS — K219 Gastro-esophageal reflux disease without esophagitis: Secondary | ICD-10-CM | POA: Insufficient documentation

## 2022-10-24 DIAGNOSIS — K2961 Other gastritis with bleeding: Secondary | ICD-10-CM | POA: Diagnosis not present

## 2022-10-24 DIAGNOSIS — R1013 Epigastric pain: Secondary | ICD-10-CM | POA: Diagnosis present

## 2022-10-24 DIAGNOSIS — E119 Type 2 diabetes mellitus without complications: Secondary | ICD-10-CM | POA: Diagnosis not present

## 2022-10-24 DIAGNOSIS — K2950 Unspecified chronic gastritis without bleeding: Secondary | ICD-10-CM | POA: Insufficient documentation

## 2022-10-24 DIAGNOSIS — Z87891 Personal history of nicotine dependence: Secondary | ICD-10-CM | POA: Insufficient documentation

## 2022-10-24 DIAGNOSIS — K2980 Duodenitis without bleeding: Secondary | ICD-10-CM

## 2022-10-24 DIAGNOSIS — K3189 Other diseases of stomach and duodenum: Secondary | ICD-10-CM | POA: Insufficient documentation

## 2022-10-24 DIAGNOSIS — R1011 Right upper quadrant pain: Secondary | ICD-10-CM | POA: Diagnosis not present

## 2022-10-24 DIAGNOSIS — Z7984 Long term (current) use of oral hypoglycemic drugs: Secondary | ICD-10-CM | POA: Diagnosis not present

## 2022-10-24 HISTORY — PX: ESOPHAGOGASTRODUODENOSCOPY (EGD) WITH PROPOFOL: SHX5813

## 2022-10-24 HISTORY — DX: Personal history of urinary calculi: Z87.442

## 2022-10-24 SURGERY — ESOPHAGOGASTRODUODENOSCOPY (EGD) WITH PROPOFOL
Anesthesia: General

## 2022-10-24 MED ORDER — GLYCOPYRROLATE 0.2 MG/ML IJ SOLN
INTRAMUSCULAR | Status: AC
  Filled 2022-10-24: qty 1

## 2022-10-24 MED ORDER — PROPOFOL 10 MG/ML IV BOLUS
INTRAVENOUS | Status: AC
  Filled 2022-10-24: qty 20

## 2022-10-24 MED ORDER — PROPOFOL 10 MG/ML IV BOLUS
INTRAVENOUS | Status: DC | PRN
  Administered 2022-10-24 (×3): 20 mg via INTRAVENOUS
  Administered 2022-10-24: 100 mg via INTRAVENOUS

## 2022-10-24 MED ORDER — LIDOCAINE HCL (CARDIAC) PF 100 MG/5ML IV SOSY
PREFILLED_SYRINGE | INTRAVENOUS | Status: DC | PRN
  Administered 2022-10-24: 100 mg via INTRAVENOUS

## 2022-10-24 MED ORDER — DEXMEDETOMIDINE HCL IN NACL 80 MCG/20ML IV SOLN
INTRAVENOUS | Status: DC | PRN
  Administered 2022-10-24: 8 ug via INTRAVENOUS

## 2022-10-24 MED ORDER — GLYCOPYRROLATE 0.2 MG/ML IJ SOLN
INTRAMUSCULAR | Status: DC | PRN
  Administered 2022-10-24: .2 mg via INTRAVENOUS

## 2022-10-24 MED ORDER — SODIUM CHLORIDE 0.9 % IV SOLN: INTRAVENOUS | Status: DC

## 2022-10-24 MED ORDER — OMEPRAZOLE 40 MG PO CPDR: 40.0000 mg | DELAYED_RELEASE_CAPSULE | Freq: Two times a day (BID) | ORAL | 3 refills | Status: DC

## 2022-10-24 MED ORDER — LIDOCAINE HCL (PF) 2 % IJ SOLN
INTRAMUSCULAR | Status: AC
  Filled 2022-10-24: qty 5

## 2022-10-24 NOTE — Op Note (Signed)
[facility] Gastroenterology Patient Name: Marcia Blake Procedure Date: 10/24/2022 12:50 PM MRN: [id number] Account #: [id number] Date of Birth: 1951-01-02 Admit Type: Outpatient Age: 72 Room: [facility] ENDO ROOM 3 Gender: Female Note Status: Finalized Instrument Name: Upper Endoscope [id number] Procedure:             Upper GI endoscopy Indications:           Epigastric abdominal pain, Abdominal pain in the right                         upper quadrant Providers:             Toney Reil MD, MD Referring MD:          Clydie Braun (Referring MD) Medicines:             General Anesthesia Complications:         No immediate complications. Estimated blood loss: None. Procedure:             Pre-Anesthesia Assessment:                        - Prior to the procedure, a History and Physical was                         performed, and patient medications and allergies were                         reviewed. The patient is competent. The risks and                         benefits of the procedure and the sedation options and                         risks were discussed with the patient. All questions                         were answered and informed consent was obtained.                         Patient identification and proposed procedure were                         verified by the physician, the nurse, the                         anesthesiologist, the anesthetist and the technician                         in the pre-procedure area in the procedure room in the                         endoscopy suite. Mental Status Examination: alert and                         oriented. Airway Examination: normal oropharyngeal                         airway and neck mobility. Respiratory Examination:  clear to auscultation. CV Examination: normal.                         Prophylactic Antibiotics: The patient does not require                         prophylactic  antibiotics. Prior Anticoagulants: The                         patient has taken no anticoagulant or antiplatelet                         agents. ASA Grade Assessment: II - A patient with mild                         systemic disease. After reviewing the risks and                         benefits, the patient was deemed in satisfactory                         condition to undergo the procedure. The anesthesia                         plan was to use general anesthesia. Immediately prior                         to administration of medications, the patient was                         re-assessed for adequacy to receive sedatives. The                         heart rate, respiratory rate, oxygen saturations,                         blood pressure, adequacy of pulmonary ventilation, and                         response to care were monitored throughout the                         procedure. The physical status of the patient was                         re-assessed after the procedure.                        After obtaining informed consent, the endoscope was                         passed under direct vision. Throughout the procedure,                         the patient's blood pressure, pulse, and oxygen                         saturations were monitored continuously. The Endoscope  was introduced through the mouth, and advanced to the                         second part of duodenum. The upper GI endoscopy was                         accomplished without difficulty. The patient tolerated                         the procedure well. Findings:      Diffuse severe inflammation characterized by congestion (edema),       erosions and erythema was found in the duodenal bulb.      The second portion of the duodenum was normal.      Localized severe inflammation characterized by congestion (edema),       erosions and friability was found on the greater curvature of the        gastric body. Biopsies were taken with a cold forceps for histology.      Multiple dispersed diminutive erosions with no bleeding and no stigmata       of recent bleeding were found at the incisura and in the gastric antrum.       Biopsies were taken with a cold forceps for histology.      The cardia and gastric fundus were normal on retroflexion.      The gastroesophageal junction and examined esophagus were normal. Impression:            - Duodenitis.                        - Normal second portion of the duodenum.                        - Gastritis. Biopsied.                        - Erosive gastropathy with no bleeding and no stigmata                         of recent bleeding. Biopsied.                        - Normal gastroesophageal junction and esophagus. Recommendation:        - Await pathology results.                        - Discharge patient to home (with escort).                        - Resume previous diet today.                        - Continue present medications.                        - Follow an antireflux regimen indefinitely.                        - Use Prilosec (omeprazole) 40 mg PO BID for 3 months  then once daily. Procedure Code(s):     --- Professional ---                        [phone], Esophagogastroduodenoscopy, flexible,                         transoral; with biopsy, single or multiple Diagnosis Code(s):     --- Professional ---                        K29.80, Duodenitis without bleeding                        K29.70, Gastritis, unspecified, without bleeding                        K31.89, Other diseases of stomach and duodenum                        R10.13, Epigastric pain                        R10.11, Right upper quadrant pain CPT copyright 2022 American Medical Association. All rights reserved. The codes documented in this report are preliminary and upon coder review may  be revised to meet current compliance requirements. Dr.  Libby Maw Toney Reil MD, MD 10/24/2022 1:06:07 PM This report has been signed electronically. Number of Addenda: 0 Note Initiated On: 10/24/2022 12:50 PM Estimated Blood Loss:  Estimated blood loss: none.      [facility]

## 2022-10-24 NOTE — H&P (Signed)
Marcia Repress, MD [address]  [address]  [place], [place] [address]  Main: [phone]  Fax: [phone] Pager: [phone]  Primary Care Physician:  Marcia Sell, MD Primary Gastroenterologist:  Dr. Arlyss Blake  Pre-Procedure History & Physical: HPI:  Marcia Blake is a 72 y.o. female is here for an endoscopy.   Past Medical History:  Diagnosis Date   Cancer [facility]) [phone]   breast-rt.   Diabetes mellitus without complication (HCC)    History of kidney stones     Past Surgical History:  Procedure Laterality Date   ABDOMINAL HYSTERECTOMY     bilateral mastectomies Bilateral    BREAST SURGERY     CATARACT EXTRACTION Bilateral    COLONOSCOPY WITH PROPOFOL N/A 02/04/2019   Procedure: COLONOSCOPY WITH PROPOFOL;  Surgeon: Marcia Reil, MD;  Location: [facility] ENDOSCOPY;  Service: Gastroenterology;  Laterality: N/A;   duodenal carcinoid tumor resection     feet surgery Bilateral    [place] KOMAN RT BREAST ([facility] HX) Right     Prior to Admission medications   Medication Sig Start Date End Date Taking? Authorizing Provider  aspirin 81 MG EC tablet Take 81 mg by mouth daily. 10/20/19  Yes [provider]  atorvastatin (LIPITOR) 20 MG tablet Take 20 mg by mouth daily.   Yes [provider]  omeprazole (PRILOSEC) 20 MG capsule Take 20 mg by mouth daily.   Yes [provider]  acetaminophen (TYLENOL) 500 MG tablet Take 500 mg by mouth every 6 (six) hours as needed.    [provider]  fluocinonide (LIDEX) 0.05 % external solution Use daily as needed to affected areas on scalp, ears for dryness. 11/14/21   Marcia Blake, [place], MD  Multiple Vitamin (MULTIVITAMIN) capsule Take 1 capsule by mouth daily.    [provider]  mupirocin ointment (BACTROBAN) 2 % Apply 1 Application topically 2 (two) times daily. Apply to affect area at neck and cover with bandage until healed. 11/14/21   Marcia Blake, [place], MD    Allergies as of 10/14/2022  - Review Complete 10/14/2022  Allergen Reaction Noted   Elemental sulfur Rash 04/13/2017   Penicillins Rash 04/13/2017    Family History  Problem Relation Age of Onset   Diabetes Sister     Social History   Socioeconomic History   Marital status: Divorced    Spouse name: Not on file   Number of children: Not on file   Years of education: Not on file   Highest education level: Not on file  Occupational History   Not on file  Tobacco Use   Smoking status: Former    Types: Cigarettes    Quit date: 1970    Years since quitting: 54.4    Passive exposure: Past   Smokeless tobacco: Never  Vaping Use   Vaping Use: Never used  Substance and Sexual Activity   Alcohol use: Yes    Comment: socially   Drug use: Never   Sexual activity: Yes    Birth control/protection: Post-menopausal  Other Topics Concern   Not on file  Social History Narrative   Not on file   Social Determinants of Health   Financial Resource Strain: Not on file  Food Insecurity: Not on file  Transportation Needs: Not on file  Physical Activity: Not on file  Stress: Not on file  Social Connections: Not on file  Intimate Partner Violence: Not on file    Review of Systems: See HPI, otherwise negative ROS  Physical  Exam: BP (!) 140/72   Pulse 67   Temp (!) 97.2 F (36.2 C) (Temporal)   Resp 18   Ht 5' 2.5" (1.588 m)   Wt 79.2 kg   SpO2 100%   BMI 31.43 kg/m  General:   Alert,  pleasant and cooperative in NAD Head:  Normocephalic and atraumatic. Neck:  Supple; no masses or thyromegaly. Lungs:  Clear throughout to auscultation.    Heart:  Regular rate and rhythm. Abdomen:  Soft, nontender and nondistended. Normal bowel sounds, without guarding, and without rebound.   Neurologic:  Alert and  oriented x4;  grossly normal neurologically.  Impression/Plan: Marcia Blake is here for an endoscopy to be performed for RUQ pain  Risks, benefits, limitations, and alternatives regarding  endoscopy  have been reviewed with the patient.  Questions have been answered.  All parties agreeable.   Marcia Donath, MD  10/24/2022, 12:55 PM

## 2022-10-24 NOTE — Anesthesia Postprocedure Evaluation (Signed)
Anesthesia Post Note  Patient: Marcia Blake  Procedure(s) Performed: ESOPHAGOGASTRODUODENOSCOPY (EGD) WITH PROPOFOL  Patient location during evaluation: PACU Anesthesia Type: General Level of consciousness: awake Pain management: satisfactory to patient Vital Signs Assessment: post-procedure vital signs reviewed and stable Respiratory status: spontaneous breathing Cardiovascular status: stable Anesthetic complications: no   No notable events documented.   Last Vitals:  Vitals:   10/24/22 1321 10/24/22 1330  BP: 117/63 134/70  Pulse: 78 76  Resp: 18 18  Temp:    SpO2: 98% 100%    Last Pain:  Vitals:   10/24/22 1321  TempSrc:   PainSc: 0-No pain                 VAN STAVEREN,Latera Mclin

## 2022-10-24 NOTE — Anesthesia Preprocedure Evaluation (Signed)
Anesthesia Evaluation  Patient identified by MRN, date of birth, ID band Patient awake    Reviewed: Allergy & Precautions, NPO status , Patient's Chart, lab work & pertinent test results  Airway Mallampati: II  TM Distance: >3 FB Neck ROM: full    Dental  (+) Teeth Intact   Pulmonary neg pulmonary ROS, former smoker   Pulmonary exam normal breath sounds clear to auscultation       Cardiovascular negative cardio ROS Normal cardiovascular exam Rhythm:Regular Rate:Normal     Neuro/Psych negative neurological ROS  negative psych ROS   GI/Hepatic negative GI ROS, Neg liver ROS,GERD  Medicated,,  Endo/Other  negative endocrine ROSdiabetes, Type 2, Oral Hypoglycemic Agents    Renal/GU negative Renal ROS  negative genitourinary   Musculoskeletal   Abdominal Normal abdominal exam  (+)   Peds negative pediatric ROS (+)  Hematology negative hematology ROS (+)   Anesthesia Other Findings Past Medical History: [id number]: Cancer (HCC)     Comment:  breast-rt. No date: Diabetes mellitus without complication (HCC) No date: History of kidney stones  Past Surgical History: No date: ABDOMINAL HYSTERECTOMY No date: bilateral mastectomies; Bilateral No date: BREAST SURGERY No date: CATARACT EXTRACTION; Bilateral 02/04/2019: COLONOSCOPY WITH PROPOFOL; N/A     Comment:  Procedure: COLONOSCOPY WITH PROPOFOL;  Surgeon: Toney Reil, MD;  Location: [facility] ENDOSCOPY;  Service:               Gastroenterology;  Laterality: N/A; No date: duodenal carcinoid tumor resection No date: feet surgery; Bilateral No date: [place] KOMAN RT BREAST ([facility] HX); Right     Reproductive/Obstetrics negative OB ROS                             Anesthesia Physical Anesthesia Plan  ASA: 2  Anesthesia Plan: General   Post-op Pain Management:    Induction: Intravenous  PONV Risk Score and Plan: Propofol  infusion and TIVA  Airway Management Planned: Natural Airway  Additional Equipment:   Intra-op Plan:   Post-operative Plan:   Informed Consent: I have reviewed the patients History and Physical, chart, labs and discussed the procedure including the risks, benefits and alternatives for the proposed anesthesia with the patient or authorized representative who has indicated his/her understanding and acceptance.     Dental Advisory Given  Plan Discussed with: CRNA and Surgeon  Anesthesia Plan Comments:        Anesthesia Quick Evaluation

## 2022-10-24 NOTE — Transfer of Care (Signed)
Immediate Anesthesia Transfer of Care Note  Patient: Marcia Blake  Procedure(s) Performed: ESOPHAGOGASTRODUODENOSCOPY (EGD) WITH PROPOFOL  Patient Location: Endoscopy Unit  Anesthesia Type:General  Level of Consciousness: drowsy  Airway & Oxygen Therapy: Patient Spontanous Breathing  Post-op Assessment: Report given to RN and Post -op Vital signs reviewed and stable  Post vital signs: Reviewed and stable  Last Vitals:  Vitals Value Taken Time  BP 101/56 10/24/22 1309  Temp 35.8 1308  Pulse 83 10/24/22 1308  Resp 16 10/24/22 1308  SpO2 95 % 10/24/22 1309  Vitals shown include unvalidated device data.  Last Pain:  Vitals:   10/24/22 1200  TempSrc: Temporal         Complications: No notable events documented.

## 2022-10-25 ENCOUNTER — Encounter: Payer: Self-pay | Admitting: Gastroenterology

## 2022-11-20 ENCOUNTER — Other Ambulatory Visit: Payer: Medicare Other

## 2022-11-20 ENCOUNTER — Encounter: Payer: Self-pay | Admitting: Oncology

## 2022-11-20 ENCOUNTER — Ambulatory Visit: Payer: Medicare Other | Admitting: Oncology

## 2022-11-20 ENCOUNTER — Inpatient Hospital Stay (HOSPITAL_BASED_OUTPATIENT_CLINIC_OR_DEPARTMENT_OTHER): Payer: Medicare Other | Admitting: Oncology

## 2022-11-20 ENCOUNTER — Inpatient Hospital Stay: Payer: Medicare Other | Attending: Oncology

## 2022-11-20 VITALS — BP 119/61 | HR 70 | Temp 96.2°F | Ht 62.5 in | Wt 175.3 lb

## 2022-11-20 DIAGNOSIS — Z87891 Personal history of nicotine dependence: Secondary | ICD-10-CM | POA: Diagnosis not present

## 2022-11-20 DIAGNOSIS — Z85020 Personal history of malignant carcinoid tumor of stomach: Secondary | ICD-10-CM | POA: Insufficient documentation

## 2022-11-20 DIAGNOSIS — Z08 Encounter for follow-up examination after completed treatment for malignant neoplasm: Secondary | ICD-10-CM

## 2022-11-20 DIAGNOSIS — Z9013 Acquired absence of bilateral breasts and nipples: Secondary | ICD-10-CM | POA: Insufficient documentation

## 2022-11-20 DIAGNOSIS — Z90710 Acquired absence of both cervix and uterus: Secondary | ICD-10-CM | POA: Insufficient documentation

## 2022-11-20 DIAGNOSIS — Z86000 Personal history of in-situ neoplasm of breast: Secondary | ICD-10-CM | POA: Insufficient documentation

## 2022-11-20 LAB — COMPREHENSIVE METABOLIC PANEL
ALT: 12 U/L (ref 0–44)
AST: 17 U/L (ref 15–41)
Albumin: 4.1 g/dL (ref 3.5–5.0)
Alkaline Phosphatase: 71 U/L (ref 38–126)
Anion gap: 10 (ref 5–15)
BUN: 17 mg/dL (ref 8–23)
CO2: 28 mmol/L (ref 22–32)
Calcium: 9.4 mg/dL (ref 8.9–10.3)
Chloride: 101 mmol/L (ref 98–111)
Creatinine, Ser: 0.74 mg/dL (ref 0.44–1.00)
GFR, Estimated: >60 mL/min (ref >60)
Glucose, Bld: 104 mg/dL — ABNORMAL HIGH (ref 70–99)
Potassium: 4 mmol/L (ref 3.5–5.1)
Sodium: 139 mmol/L (ref 135–145)
Total Bilirubin: 0.4 mg/dL (ref 0.3–1.2)
Total Protein: 7.3 g/dL (ref 6.5–8.1)

## 2022-11-21 LAB — CHROMOGRANIN A: Chromogranin A (ng/mL): 289.4 ng/mL — ABNORMAL HIGH (ref 0.0–101.8)

## 2022-11-24 NOTE — Progress Notes (Signed)
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  This was followed by a right breast lumpectomy 6 weeks of radiation treatment and 5 years of tamoxifen.  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  She underwent bilateral mastectomy at that time.  Left breast mastectomy showed sclerosing fibroadenoma with calcifications 0.8 cm completely excised.  No evidence of malignancy.  Right breast mastectomy showed no residual lesion pTis NX.  She did not require adjuvant hormone therapy after bilateral mastectomy.   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  She has been getting chromogranin A levels monitored which have been chronically elevated at 123 and 151 in the past.  Colonoscopy was negative and endoscopy report showed mild inflammatory changes at anastomosis which was widely patent and performed back in 2015.    Interval history-patient is doing well overall and denies any specific complaints at this time.  She had an episode of abdominal pain for which she had a CT scan which did not show any evidence of malignancy or acute pathology.  Pain since then has resolved.  ECOG PS- 1 Pain scale- 0   Review of systems- Review of  Systems  Constitutional:  Negative for chills, fever, malaise/fatigue and weight loss.  HENT:  Negative for congestion, ear discharge and nosebleeds.   Eyes:  Negative for blurred vision.  Respiratory:  Negative for cough, hemoptysis, sputum production, shortness of breath and wheezing.   Cardiovascular:  Negative for chest pain, palpitations, orthopnea and claudication.  Gastrointestinal:  Negative for abdominal pain, blood in stool, constipation, diarrhea, heartburn, melena, nausea and vomiting.  Genitourinary:  Negative for dysuria, flank pain, frequency, hematuria and urgency.  Musculoskeletal:  Negative for back pain, joint pain and myalgias.  Skin:  Negative for rash.  Neurological:  Negative for dizziness, tingling, focal weakness, seizures, weakness and headaches.  Endo/Heme/Allergies:  Does not bruise/bleed easily.  Psychiatric/Behavioral:  Negative for depression and suicidal ideas. The patient does not have insomnia.       Allergies  Allergen Reactions   Elemental Sulfur Rash   Penicillins Rash     Past Medical History:  Diagnosis Date   Cancer (HCC) [id number]   breast-rt.   Diabetes mellitus without complication (HCC)    History of kidney stones      Past Surgical History:  Procedure Laterality Date   ABDOMINAL HYSTERECTOMY     bilateral mastectomies Bilateral    BREAST SURGERY     CATARACT EXTRACTION Bilateral    COLONOSCOPY WITH PROPOFOL N/A 02/04/2019   Procedure: COLONOSCOPY WITH PROPOFOL;  Surgeon: Toney Reil, MD;  Location: [facility] ENDOSCOPY;  Service: Gastroenterology;  Laterality: N/A;   duodenal carcinoid tumor resection     ESOPHAGOGASTRODUODENOSCOPY (EGD) WITH PROPOFOL N/A 10/24/2022   Procedure: ESOPHAGOGASTRODUODENOSCOPY (  EGD) WITH PROPOFOL;  Surgeon: Toney Reil, MD;  Location: [facility] ENDOSCOPY;  Service: Gastroenterology;  Laterality: N/A;   feet surgery Bilateral    [place] KOMAN RT BREAST ([facility] HX) Right     Social History    Socioeconomic History   Marital status: Divorced    Spouse name: Not on file   Number of children: Not on file   Years of education: Not on file   Highest education level: Not on file  Occupational History   Not on file  Tobacco Use   Smoking status: Former    Current packs/day: 0.00    Types: Cigarettes    Quit date: 1970    Years since quitting: 54.5    Passive exposure: Past   Smokeless tobacco: Never  Vaping Use   Vaping status: Never Used  Substance and Sexual Activity   Alcohol use: Yes    Comment: socially   Drug use: Never   Sexual activity: Yes    Birth control/protection: Post-menopausal  Other Topics Concern   Not on file  Social History Narrative   Not on file   Social Determinants of Health   Financial Resource Strain: Low Risk  (09/04/2022)   Received from [facility], [facility]   Overall Financial Resource Strain (CARDIA)    Difficulty of Paying Living Expenses: Not hard at all  Food Insecurity: No Food Insecurity (09/04/2022)   Received from [facility], [facility]   Hunger Vital Sign    Worried About Running Out of Food in the Last Year: Never true    Ran Out of Food in the Last Year: Never true  Transportation Needs: No Transportation Needs (09/04/2022)   Received from [facility], [facility]   [facility] - Transportation    In the past 12 months, has lack of transportation kept you from medical appointments or from getting medications?: No    Lack of Transportation (Non-Medical): No  Physical Activity: Not on file  Stress: Not on file  Social Connections: Unknown (09/24/2021)   Received from [facility]   Social Network    Social Network: Not on file  Intimate Partner Violence: Unknown (08/16/2021)   Received from [facility]   HITS    Physically Hurt: Not on file    Insult or Talk Down To: Not on file    Threaten Physical Harm: Not  on file    Scream or Curse: Not on file    Family History  Problem Relation Age of Onset   Diabetes Sister      Current Outpatient Medications:    acetaminophen (TYLENOL) 500 MG tablet, Take 500 mg by mouth every 6 (six) hours as needed., Disp: , Rfl:    aspirin 81 MG EC tablet, Take 81 mg by mouth daily., Disp: , Rfl:    atorvastatin (LIPITOR) 20 MG tablet, Take 20 mg by mouth daily., Disp: , Rfl:    fluocinonide (LIDEX) 0.05 % external solution, Use daily as needed to affected areas on scalp, ears for dryness., Disp: 180 mL, Rfl: 3   Multiple Vitamin (MULTIVITAMIN) capsule, Take 1 capsule by mouth daily., Disp: , Rfl:    mupirocin ointment (BACTROBAN) 2 %, Apply 1 Application topically 2 (two) times daily. Apply to affect area at neck and cover with bandage until healed., Disp: 22 g, Rfl: 0   omeprazole (PRILOSEC) 40 MG capsule, Take 1 capsule (40 mg total) by mouth 2 (two)  times daily before a meal., Disp: 60 capsule, Rfl: 3  Physical exam:  Vitals:   11/20/22 1027  BP: 119/61  Pulse: 70  Temp: (!) 96.2 F (35.7 C)  TempSrc: Tympanic  SpO2: 99%  Weight: 175 lb 4.8 oz (79.5 kg)  Height: 5' 2.5" (1.588 m)   Physical Exam Cardiovascular:     Rate and Rhythm: Normal rate and regular rhythm.     Heart sounds: Normal heart sounds.  Pulmonary:     Effort: Pulmonary effort is normal.     Breath sounds: Normal breath sounds.  Abdominal:     General: Bowel sounds are normal.     Palpations: Abdomen is soft.  Skin:    General: Skin is warm and dry.  Neurological:     Mental Status: She is alert and oriented to person, place, and time.         Latest Ref Rng & Units 11/20/2022    9:52 AM  CMP  Glucose 70 - 99 mg/dL 161   BUN 8 - 23 mg/dL 17   Creatinine 0.96 - 1.00 mg/dL 0.45   Sodium 409 - 811 mmol/L 139   Potassium 3.5 - 5.1 mmol/L 4.0   Chloride 98 - 111 mmol/L 101   CO2 22 - 32 mmol/L 28   Calcium 8.9 - 10.3 mg/dL 9.4   Total Protein 6.5 - 8.1 g/dL 7.3   Total  Bilirubin 0.3 - 1.2 mg/dL 0.4   Alkaline Phos 38 - 126 U/L 71   AST 15 - 41 U/L 17   ALT 0 - 44 U/L 12       Latest Ref Rng & Units 11/19/2021    2:04 PM  CBC  WBC 4.0 - 10.5 K/uL 6.7   Hemoglobin 12.0 - 15.0 g/dL 91.4   Hematocrit 78.2 - 46.0 % 37.3   Platelets 150 - 400 K/uL 236     No images are attached to the encounter.  No results found.   Assessment and plan- Patient is a 72 y.o. female who is here for routine follow-up of gastric carcinoid history  It has been 13 years since the diagnosis of her gastric carcinoid.She recently had an endoscopy by Dr. Allegra Lai in June 2024 which did not show any evidence of recurrence.  She also had a CT abdomen pelvis with contrast in May 2024 for an episode of abdominal pain which also did not reveal any acute pathology.  I will see her back in 1 year with labs.  Her baseline chromogranin a levels fluctuate between 1 70-300 but in the absence of radiological disease there is no indication for treatment at this time  Patient also has a prior history of breast cancer s/p bilateral mastectomy and it has been more than 5 years since the diagnosis.  She does not require any follow-up for this either.   Visit Diagnosis 1. H/O malignant carcinoid tumor of stomach      Dr. Owens Shark, MD, MPH [facility] at [facility] [phone] 11/24/2022 4:10 PM

## 2022-11-27 ENCOUNTER — Encounter: Payer: Self-pay | Admitting: Oncology

## 2022-11-27 ENCOUNTER — Encounter: Payer: Medicare Other | Admitting: Dermatology

## 2022-11-28 ENCOUNTER — Telehealth: Payer: Self-pay | Admitting: Gastroenterology

## 2022-11-28 ENCOUNTER — Other Ambulatory Visit: Payer: Self-pay | Admitting: *Deleted

## 2022-11-28 DIAGNOSIS — Z85020 Personal history of malignant carcinoid tumor of stomach: Secondary | ICD-10-CM

## 2022-11-28 NOTE — Telephone Encounter (Signed)
Patient is calling because she has concerns about the omeprazole that she is taking. She states that she gets her chromogranin A level checked every 6 months and it is increasing the last two times it has been checked. She states she has been doing some researching and that the omeprazole could increase this level. She states her PCP started her on this medication and then we increased her to twice a day after her EGD. She is very concern about being on this medication now and her chromogranin levels keep going up.

## 2022-11-28 NOTE — Telephone Encounter (Signed)
PT left message having difficulties sending Dr, Allegra Lai a message  through my chart pt would like a call back

## 2022-11-28 NOTE — Telephone Encounter (Signed)
Repeat chromogrannin and ct abdomen with contrast in nov 2024

## 2022-12-01 NOTE — Telephone Encounter (Signed)
I increased the Prilosec/omeprazole to twice daily because of her symptoms and based on EGD findings.  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  This is a temporary phenomena and once she decreases or stops the medication, chromogranin A levels will go down to her baseline. If she is very concerned looking at the number, she can definitely decrease omeprazole to 40 mg once a day or switch to a less stronger medication such as Pepcid  If her symptoms of epigastric pain and right upper quadrant pain are persistent, I can refer her to general surgery to evaluate her gallbladder  Konnor Jorden

## 2022-12-02 MED ORDER — OMEPRAZOLE 40 MG PO CPDR: 40.0000 mg | DELAYED_RELEASE_CAPSULE | Freq: Every day | ORAL | 1 refills | Status: DC

## 2022-12-02 MED ORDER — OMEPRAZOLE 20 MG PO CPDR: 20.0000 mg | DELAYED_RELEASE_CAPSULE | Freq: Every day | ORAL | 0 refills | Status: DC

## 2022-12-02 NOTE — Telephone Encounter (Signed)
Patient wants to decrease down to omeprazole once a day and wants to know how long she needs to be on the medication for. She states she is needing a refill on the medication from the pharmacy that she going to call in. So I said I would call in a new script with the new instructions

## 2022-12-02 NOTE — Addendum Note (Signed)
Addended by: Radene Knee L on: 12/02/2022 08:56 AM   Modules accepted: Orders

## 2022-12-02 NOTE — Telephone Encounter (Signed)
Called and informed patient and she verbalized understanding of instructions

## 2022-12-02 NOTE — Telephone Encounter (Signed)
Have her take 40 mg once a day for 1 month and then switch to 20 mg daily.  She can also try over-the-counter Pepcid after she finishes omeprazole  RV

## 2022-12-02 NOTE — Addendum Note (Signed)
Addended by: Radene Knee L on: 12/02/2022 03:39 PM   Modules accepted: Orders

## 2022-12-30 ENCOUNTER — Telehealth: Payer: Self-pay

## 2022-12-30 ENCOUNTER — Ambulatory Visit: Payer: Medicare Other | Admitting: Gastroenterology

## 2022-12-30 NOTE — Telephone Encounter (Signed)
Pt wanted to see if she needs to f/u with you... I did not see any documentation stating any specific timeline with f/u... Please advise

## 2022-12-30 NOTE — Telephone Encounter (Signed)
I did not think she needed to follow-up unless she has any GI concerns/symptoms that needed to be addressed in the office  RV

## 2022-12-31 NOTE — Telephone Encounter (Signed)
Pt is aware as instructed 

## 2023-01-09 ENCOUNTER — Encounter: Payer: Self-pay | Admitting: Dermatology

## 2023-01-09 ENCOUNTER — Ambulatory Visit (INDEPENDENT_AMBULATORY_CARE_PROVIDER_SITE_OTHER): Payer: Medicare Other | Admitting: Dermatology

## 2023-01-09 VITALS — BP 140/80 | HR 88

## 2023-01-09 DIAGNOSIS — D239 Other benign neoplasm of skin, unspecified: Secondary | ICD-10-CM

## 2023-01-09 DIAGNOSIS — D235 Other benign neoplasm of skin of trunk: Secondary | ICD-10-CM

## 2023-01-09 DIAGNOSIS — L905 Scar conditions and fibrosis of skin: Secondary | ICD-10-CM

## 2023-01-09 DIAGNOSIS — L821 Other seborrheic keratosis: Secondary | ICD-10-CM

## 2023-01-09 DIAGNOSIS — D2371 Other benign neoplasm of skin of right lower limb, including hip: Secondary | ICD-10-CM

## 2023-01-09 DIAGNOSIS — W908XXA Exposure to other nonionizing radiation, initial encounter: Secondary | ICD-10-CM

## 2023-01-09 DIAGNOSIS — L432 Lichenoid drug reaction: Secondary | ICD-10-CM | POA: Diagnosis not present

## 2023-01-09 DIAGNOSIS — I8392 Asymptomatic varicose veins of left lower extremity: Secondary | ICD-10-CM

## 2023-01-09 DIAGNOSIS — L814 Other melanin hyperpigmentation: Secondary | ICD-10-CM

## 2023-01-09 DIAGNOSIS — D492 Neoplasm of unspecified behavior of bone, soft tissue, and skin: Secondary | ICD-10-CM

## 2023-01-09 DIAGNOSIS — I781 Nevus, non-neoplastic: Secondary | ICD-10-CM

## 2023-01-09 DIAGNOSIS — D2362 Other benign neoplasm of skin of left upper limb, including shoulder: Secondary | ICD-10-CM

## 2023-01-09 DIAGNOSIS — L578 Other skin changes due to chronic exposure to nonionizing radiation: Secondary | ICD-10-CM

## 2023-01-09 DIAGNOSIS — Z1283 Encounter for screening for malignant neoplasm of skin: Secondary | ICD-10-CM

## 2023-01-09 DIAGNOSIS — L700 Acne vulgaris: Secondary | ICD-10-CM

## 2023-01-09 DIAGNOSIS — D229 Melanocytic nevi, unspecified: Secondary | ICD-10-CM

## 2023-01-09 NOTE — Progress Notes (Signed)
Follow-Up Visit   Subjective  Marcia Blake is a 72 y.o. female who presents for the following: Skin Cancer Screening and Full Body Skin Exam, patient has a spot on her back and left temple she think is changing.   The patient presents for Total-Body Skin Exam (TBSE) for skin cancer screening and mole check. The patient has spots, moles and lesions to be evaluated, some may be new or changing and the patient may have concern these could be cancer.    The following portions of the chart were reviewed this encounter and updated as appropriate: medications, allergies, medical history  Review of Systems:  No other skin or systemic complaints except as noted in HPI or Assessment and Plan.  Objective  Well appearing patient in no apparent distress; mood and affect are within normal limits.  A full examination was performed including scalp, head, eyes, ears, nose, lips, neck, chest, axillae, abdomen, back, buttocks, bilateral upper extremities, bilateral lower extremities, hands, feet, fingers, toes, fingernails, and toenails. All findings within normal limits unless otherwise noted below.  Exam of finger nails and toes limited by presence of nail polish.   Relevant physical exam findings are noted in the Assessment and Plan.  left zygoma 8.0 mm eroded indurated violaceous papule          Assessment & Plan   SKIN CANCER SCREENING PERFORMED TODAY.  ACTINIC DAMAGE - Chronic condition, secondary to cumulative UV/sun exposure - diffuse scaly erythematous macules with underlying dyspigmentation - Recommend daily broad spectrum sunscreen SPF 30+ to sun-exposed areas, reapply every 2 hours as needed.  - Staying in the shade or wearing long sleeves, sun glasses (UVA+UVB protection) and wide brim hats (4-inch brim around the entire circumference of the hat) are also recommended for sun protection.  - Call for new or changing lesions.  LENTIGINES, SEBORRHEIC KERATOSES, Spider veins,  dilated pore of winer on back - Benign normal skin lesions - Benign-appearing - Call for any changes  DERMATOFIBROMA Exam: Firm pink/brown papulenodule with dimple sign. On right medial distal thigh and left shoulder Treatment Plan: A dermatofibroma is a benign growth possibly related to trauma, such as an insect bite, cut from shaving, or inflamed acne-type bump.  Treatment options to remove include shave or excision with resulting scar and risk of recurrence.  Since benign-appearing and not bothersome, will observe for now.    MELANOCYTIC NEVI - Tan-brown and/or pink-flesh-colored symmetric macules and papules - Benign appearing on exam today - Observation - Call clinic for new or changing moles - Recommend daily use of broad spectrum spf 30+ sunscreen to sun-exposed areas.   SCAR Back  Exam: Dyspigmented smooth macule or patch. Benign-appearing.  Observation.  Call clinic for new or changing lesions. Recommend daily broad spectrum sunscreen SPF 30+, reapply every 2 hours as needed. Treatment: Recommend Serica moisturizing scar formula cream every night or [facility] brand or Mederma silicone scar sheet every night for the first year after a scar appears to help with scar remodeling if desired. Scars remodel on their own for a full year and will gradually improve in appearance over time.   Neoplasm of skin left zygoma  Skin / nail biopsy Type of biopsy: tangential   Informed consent: discussed and consent obtained   Patient was prepped and draped in usual sterile fashion: area prepped with alochol. Anesthesia: the lesion was anesthetized in a standard fashion   Anesthetic:  1% lidocaine w/ epinephrine 1-100,000 buffered w/ 8.4% NaHCO3 Instrument used: flexible razor blade  Hemostasis achieved with: pressure, aluminum chloride and electrodesiccation   Outcome: patient tolerated procedure well   Post-procedure details: wound care instructions given   Post-procedure details  comment:  Ointment and small bandage  Specimen 1 - Surgical pathology Differential Diagnosis: R/O SCC vs Chronic wound   Check Margins: No  R/O SCC vs Chronic wound/prurigo nodule  Multiple benign nevi  Seborrheic keratoses  Lentigines  Actinic elastosis  Dermatofibroma  Dilated pore of Winer of back  Spider vein of left lower extremity   Return in about 1 year (around 01/09/2024) for TBSE, SKs .  I, Angelique Holm, CMA, am acting as scribe for Elie Goody, MD .   Documentation: I have reviewed the above documentation for accuracy and completeness, and I agree with the above.  Elie Goody, MD

## 2023-01-09 NOTE — Patient Instructions (Addendum)

## 2023-01-20 ENCOUNTER — Encounter: Payer: Self-pay | Admitting: Dermatology

## 2023-01-29 ENCOUNTER — Other Ambulatory Visit: Payer: Self-pay | Admitting: Gastroenterology

## 2023-01-30 ENCOUNTER — Telehealth: Payer: Self-pay | Admitting: Gastroenterology

## 2023-01-30 ENCOUNTER — Other Ambulatory Visit: Payer: Self-pay | Admitting: Gastroenterology

## 2023-01-30 NOTE — Telephone Encounter (Signed)
Pt requesting call back to get clarification on her omeprazole dosage

## 2023-01-30 NOTE — Telephone Encounter (Signed)
Called and left a message for call back.  Per Dr. Allegra Lai on 12/02/2022 only wanted patient to take the Omeprazole  40mg  once a day for one month then omeprazole 20mg  for 1 month then go to otc Pepcid

## 2023-02-03 ENCOUNTER — Other Ambulatory Visit: Payer: Self-pay | Admitting: Gastroenterology

## 2023-02-20 ENCOUNTER — Ambulatory Visit: Payer: Medicare Other | Admitting: Dermatology

## 2023-03-17 ENCOUNTER — Other Ambulatory Visit: Payer: Medicare Other

## 2023-03-17 ENCOUNTER — Ambulatory Visit
Admission: RE | Admit: 2023-03-17 | Discharge: 2023-03-17 | Disposition: A | Discharge status: Home / Self Care | Payer: Medicare Other | Source: Ambulatory Visit | Attending: Oncology | Admitting: Oncology

## 2023-03-17 DIAGNOSIS — Z85020 Personal history of malignant carcinoid tumor of stomach: Secondary | ICD-10-CM | POA: Insufficient documentation

## 2023-03-17 LAB — POCT I-STAT CREATININE: Creatinine, Ser: 0.9 mg/dL (ref 0.44–1.00)

## 2023-03-17 MED ORDER — IOHEXOL 300 MG/ML  SOLN
100.0000 mL | Freq: Once | INTRAMUSCULAR | Status: AC | PRN
  Administered 2023-03-17: 100 mL via INTRAVENOUS

## 2023-03-26 ENCOUNTER — Encounter: Payer: Self-pay | Admitting: Oncology

## 2023-03-26 ENCOUNTER — Inpatient Hospital Stay: Payer: Medicare Other | Attending: Oncology | Admitting: Oncology

## 2023-03-26 ENCOUNTER — Inpatient Hospital Stay: Payer: Medicare Other

## 2023-03-26 VITALS — BP 134/60 | HR 75 | Temp 97.5°F | Resp 18 | Wt 174.6 lb

## 2023-03-26 DIAGNOSIS — D3A092 Benign carcinoid tumor of the stomach: Secondary | ICD-10-CM

## 2023-03-26 DIAGNOSIS — Z85020 Personal history of malignant carcinoid tumor of stomach: Secondary | ICD-10-CM | POA: Insufficient documentation

## 2023-03-26 DIAGNOSIS — Z853 Personal history of malignant neoplasm of breast: Secondary | ICD-10-CM | POA: Insufficient documentation

## 2023-03-26 DIAGNOSIS — Z9013 Acquired absence of bilateral breasts and nipples: Secondary | ICD-10-CM | POA: Diagnosis not present

## 2023-03-26 DIAGNOSIS — Z90710 Acquired absence of both cervix and uterus: Secondary | ICD-10-CM | POA: Insufficient documentation

## 2023-03-26 DIAGNOSIS — Z87891 Personal history of nicotine dependence: Secondary | ICD-10-CM | POA: Diagnosis not present

## 2023-03-26 LAB — CBC WITH DIFFERENTIAL (CANCER CENTER ONLY)
Abs Immature Granulocytes: 0.02 10*3/uL (ref 0.00–0.07)
Basophils Absolute: 0.1 10*3/uL (ref 0.0–0.1)
Basophils Relative: 1 %
Eosinophils Absolute: 0.1 10*3/uL (ref 0.0–0.5)
Eosinophils Relative: 2 %
HCT: 39.1 % (ref 36.0–46.0)
Hemoglobin: 12.6 g/dL (ref 12.0–15.0)
Immature Granulocytes: 0 %
Lymphocytes Relative: 34 %
Lymphs Abs: 2.4 10*3/uL (ref 0.7–4.0)
MCH: 29.5 pg (ref 26.0–34.0)
MCHC: 32.2 g/dL (ref 30.0–36.0)
MCV: 91.6 fL (ref 80.0–100.0)
Monocytes Absolute: 0.5 10*3/uL (ref 0.1–1.0)
Monocytes Relative: 8 %
Neutro Abs: 3.9 10*3/uL (ref 1.7–7.7)
Neutrophils Relative %: 55 %
Platelet Count: 277 10*3/uL (ref 150–400)
RBC: 4.27 MIL/uL (ref 3.87–5.11)
RDW: 12.3 % (ref 11.5–15.5)
WBC Count: 7 10*3/uL (ref 4.0–10.5)
nRBC: 0 % (ref 0.0–0.2)

## 2023-03-26 LAB — CMP (CANCER CENTER ONLY)
ALT: 15 U/L (ref 0–44)
AST: 19 U/L (ref 15–41)
Albumin: 4.3 g/dL (ref 3.5–5.0)
Alkaline Phosphatase: 76 U/L (ref 38–126)
Anion gap: 10 (ref 5–15)
BUN: 15 mg/dL (ref 8–23)
CO2: 28 mmol/L (ref 22–32)
Calcium: 9.7 mg/dL (ref 8.9–10.3)
Chloride: 101 mmol/L (ref 98–111)
Creatinine: 0.78 mg/dL (ref 0.44–1.00)
GFR, Estimated: >60 mL/min (ref >60)
Glucose, Bld: 95 mg/dL (ref 70–99)
Potassium: 4.2 mmol/L (ref 3.5–5.1)
Sodium: 139 mmol/L (ref 135–145)
Total Bilirubin: 0.7 mg/dL (ref <1.2)
Total Protein: 7.9 g/dL (ref 6.5–8.1)

## 2023-03-26 NOTE — Progress Notes (Signed)
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  This was followed by a right breast lumpectomy 6 weeks of radiation treatment and 5 years of tamoxifen.  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  She underwent bilateral mastectomy at that time.  Left breast mastectomy showed sclerosing fibroadenoma with calcifications 0.8 cm completely excised.  No evidence of malignancy.  Right breast mastectomy showed no residual lesion pTis NX.  She did not require adjuvant hormone therapy after bilateral mastectomy.   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  She has been getting chromogranin A levels monitored which have been chronically elevated at 123 and 151 in the past.  Colonoscopy was negative and endoscopy report showed mild inflammatory changes at anastomosis which was widely patent and performed back in 2015.      Interval history-she was recently switched from omeprazole back to Pepcid but feels that her heartburn symptoms are coming back.  She plans to get in touch with Dr. Allegra Lai about this.  Denies any nausea vomiting or diarrhea  ECOG PS- 1 Pain scale- 0   Review of systems- Review of Systems  Constitutional:   Negative for chills, fever, malaise/fatigue and weight loss.  HENT:  Negative for congestion, ear discharge and nosebleeds.   Eyes:  Negative for blurred vision.  Respiratory:  Negative for cough, hemoptysis, sputum production, shortness of breath and wheezing.   Cardiovascular:  Negative for chest pain, palpitations, orthopnea and claudication.  Gastrointestinal:  Negative for abdominal pain, blood in stool, constipation, diarrhea, heartburn, melena, nausea and vomiting.  Genitourinary:  Negative for dysuria, flank pain, frequency, hematuria and urgency.  Musculoskeletal:  Negative for back pain, joint pain and myalgias.  Skin:  Negative for rash.  Neurological:  Negative for dizziness, tingling, focal weakness, seizures, weakness and headaches.  Endo/Heme/Allergies:  Does not bruise/bleed easily.  Psychiatric/Behavioral:  Negative for depression and suicidal ideas. The patient does not have insomnia.       Allergies  Allergen Reactions   Elemental Sulfur Rash   Penicillins Rash     Past Medical History:  Diagnosis Date   Cancer (HCC) [phone]   breast-rt.   Diabetes mellitus without complication (HCC)    History of kidney stones      Past Surgical History:  Procedure Laterality Date   ABDOMINAL HYSTERECTOMY     bilateral mastectomies Bilateral    BREAST SURGERY     CATARACT EXTRACTION Bilateral    COLONOSCOPY WITH PROPOFOL N/A 02/04/2019   Procedure: COLONOSCOPY WITH PROPOFOL;  Surgeon: Toney Reil, MD;  Location: [facility] ENDOSCOPY;  Service: Gastroenterology;  Laterality: N/A;   duodenal carcinoid tumor resection     ESOPHAGOGASTRODUODENOSCOPY (EGD) WITH PROPOFOL N/A 10/24/2022   Procedure: ESOPHAGOGASTRODUODENOSCOPY (EGD) WITH PROPOFOL;  Surgeon: Allegra Lai,  Loel Dubonnet, MD;  Location: [facility] ENDOSCOPY;  Service: Gastroenterology;  Laterality: N/A;   feet surgery Bilateral    [place] RT BREAST ([facility] HX) Right     Social History   Socioeconomic History   Marital  status: Divorced    Spouse name: Not on file   Number of children: Not on file   Years of education: Not on file   Highest education level: Not on file  Occupational History   Not on file  Tobacco Use   Smoking status: Former    Current packs/day: 0.00    Types: Cigarettes    Quit date: 1970    Years since quitting: 54.9    Passive exposure: Past   Smokeless tobacco: Never  Vaping Use   Vaping status: Never Used  Substance and Sexual Activity   Alcohol use: Yes    Comment: socially   Drug use: Never   Sexual activity: Yes    Birth control/protection: Post-menopausal  Other Topics Concern   Not on file  Social History Narrative   Not on file   Social Determinants of Health   Financial Resource Strain: Low Risk  (09/04/2022)   Received from [facility], [facility]   Overall Financial Resource Strain (CARDIA)    Difficulty of Paying Living Expenses: Not hard at all  Food Insecurity: No Food Insecurity (09/04/2022)   Received from [facility], [facility]   Hunger Vital Sign    Worried About Running Out of Food in the Last Year: Never true    Ran Out of Food in the Last Year: Never true  Transportation Needs: No Transportation Needs (09/04/2022)   Received from [facility], [facility]   [facility] - Transportation    In the past 12 months, has lack of transportation kept you from medical appointments or from getting medications?: No    Lack of Transportation (Non-Medical): No  Physical Activity: Not on file  Stress: Not on file  Social Connections: Unknown (09/24/2021)   Received from [facility], [facility]   Social Network    Social Network: Not on file  Intimate Partner Violence: Unknown (08/16/2021)   Received from [facility], [facility]   HITS    Physically Hurt: Not on file    Insult or Talk Down To: Not on file    Threaten Physical Harm: Not on file     Scream or Curse: Not on file    Family History  Problem Relation Age of Onset   Diabetes Sister      Current Outpatient Medications:    Famotidine (PEPCID AC PO), , Disp: , Rfl:    acetaminophen (TYLENOL) 500 MG tablet, Take 500 mg by mouth every 6 (six) hours as needed., Disp: , Rfl:    aspirin 81 MG EC tablet, Take 81 mg by mouth daily., Disp: , Rfl:    atorvastatin (LIPITOR) 20 MG tablet, Take 20 mg by mouth daily., Disp: , Rfl:    fluocinonide (LIDEX) 0.05 % external solution, Use daily as needed to affected areas on scalp, ears for dryness., Disp: 180 mL, Rfl: 3   Multiple Vitamin (MULTIVITAMIN) capsule, Take 1 capsule by mouth daily., Disp: , Rfl:    mupirocin ointment (BACTROBAN) 2 %, Apply 1 Application topically 2 (two) times daily. Apply to affect area at neck and cover with bandage until healed., Disp: 22 g, Rfl: 0   omeprazole (PRILOSEC) 20 MG capsule, Take  1 capsule (20 mg total) by mouth daily. (Patient not taking: Reported on 03/26/2023), Disp: 30 capsule, Rfl: 0   omeprazole (PRILOSEC) 40 MG capsule, Take 1 capsule (40 mg total) by mouth daily before breakfast. (Patient not taking: Reported on 03/26/2023), Disp: 30 capsule, Rfl: 1  Physical exam:  Vitals:   03/26/23 1117  BP: 134/60  Pulse: 75  Resp: 18  Temp: (!) 97.5 F (36.4 C)  TempSrc: Tympanic  SpO2: 100%  Weight: 174 lb 9.6 oz (79.2 kg)   Physical Exam Cardiovascular:     Rate and Rhythm: Normal rate and regular rhythm.     Heart sounds: Normal heart sounds.  Pulmonary:     Effort: Pulmonary effort is normal.     Breath sounds: Normal breath sounds.  Abdominal:     General: Bowel sounds are normal.     Palpations: Abdomen is soft.  Skin:    General: Skin is warm and dry.  Neurological:     Mental Status: She is alert and oriented to person, place, and time.         Latest Ref Rng & Units 03/26/2023   10:55 AM  CMP  Glucose 70 - 99 mg/dL 95   BUN 8 - 23 mg/dL 15   Creatinine 6.64 - 1.00  mg/dL 4.03   Sodium 474 - 259 mmol/L 139   Potassium 3.5 - 5.1 mmol/L 4.2   Chloride 98 - 111 mmol/L 101   CO2 22 - 32 mmol/L 28   Calcium 8.9 - 10.3 mg/dL 9.7   Total Protein 6.5 - 8.1 g/dL 7.9   Total Bilirubin <5.6 mg/dL 0.7   Alkaline Phos 38 - 126 U/L 76   AST 15 - 41 U/L 19   ALT 0 - 44 U/L 15       Latest Ref Rng & Units 03/26/2023   10:55 AM  CBC  WBC 4.0 - 10.5 K/uL 7.0   Hemoglobin 12.0 - 15.0 g/dL 38.7   Hematocrit 56.4 - 46.0 % 39.1   Platelets 150 - 400 K/uL 277     No images are attached to the encounter.  CT ABDOMEN W CONTRAST  Result Date: 03/26/2023 CLINICAL DATA:  History of gastric carcinoid tumor post resection. Surveillance. History of recurrent right breast cancer. * Tracking Code: BO * EXAM: CT ABDOMEN WITH CONTRAST TECHNIQUE: Multidetector CT imaging of the abdomen was performed using the standard protocol following bolus administration of intravenous contrast. RADIATION DOSE REDUCTION: This exam was performed according to the departmental dose-optimization program which includes automated exposure control, adjustment of the mA and/or kV according to patient size and/or use of iterative reconstruction technique. CONTRAST:  OMNIPAQUE IOHEXOL 300 MG/ML  SOLN COMPARISON:  Abdominopelvic CT 09/25/2022 and 05/16/2020. FINDINGS: Lower chest: Clear lung bases. No significant pleural or pericardial effusion. Stable mild distal esophageal wall thickening. Hepatobiliary: The liver is normal in density without suspicious focal abnormality. Stable dependent density within the gallbladder fundus which may reflect small stones or polyps. No evidence of gallbladder wall thickening or biliary dilatation. Pancreas: Unremarkable. No pancreatic ductal dilatation or surrounding inflammatory changes. Spleen: Normal in size without focal abnormality. Adrenals/Urinary Tract: Both adrenal glands appear normal. No evidence of urinary tract calculus, suspicious renal lesion or  hydronephrosis. Bladder not imaged. Stomach/Bowel: No enteric contrast administered. Stable postsurgical changes in the distal stomach without evidence of recurrent mass. No bowel distension, wall thickening or surrounding inflammation. Scattered colonic diverticulosis. The transverse colon extends into a broad-based periumbilical hernia. No  evidence of incarceration or obstruction. Vascular/Lymphatic: There are no enlarged abdominal lymph nodes. Aortic and branch vessel atherosclerosis without evidence of aneurysm or large vessel occlusion. Retroaortic left renal vein. Other: Stable postsurgical changes in the anterior abdominal wall with supraumbilical hernias containing fat and a portion of the transverse colon, as before. No ascites or peritoneal nodularity. Musculoskeletal: No acute or significant osseous findings. Lower lumbar facet arthropathy. IMPRESSION: 1. Stable postsurgical changes in the distal stomach without evidence of recurrent or metastatic disease. 2. Stable postsurgical changes in the anterior abdominal wall with supraumbilical hernias containing fat and a portion of the transverse colon. No evidence of incarceration or obstruction. 3. Stable small gallstones or gallbladder polyps. 4.  Aortic Atherosclerosis (ICD10-I70.0). Electronically Signed   By: Carey Bullocks M.D.   On: 03/26/2023 12:31     Assessment and plan- Patient is a 72 y.o. 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  Official reports were not back at the time of my visit however.  It has been more than 10 years since her diagnosis of gastric carcinoid and there would be no indication to continue yearly scans at this time.  Patient is always concerned about her chromogranin a levels which have fluctuated in  the past and even if they have gone up and down her scans have not shown any evidence of recurrent disease.  Patient will need to stop her omeprazole or any PPI if she plans to go back for 2 weeks prior to checking her chromogranin levels in the future.  CBC with differential CMP chromogranin A levels in 6 months in 1 year and I will see her back in 1 year   Visit Diagnosis 1. Benign carcinoid tumor of stomach      Dr. Owens Shark, MD, MPH [facility] at [facility] [phone] 03/26/2023 3:26 PM

## 2023-03-27 LAB — CHROMOGRANIN A: Chromogranin A (ng/mL): 301.6 ng/mL — ABNORMAL HIGH (ref 0.0–101.8)

## 2023-04-29 ENCOUNTER — Encounter: Payer: Self-pay | Admitting: Urology

## 2023-05-16 NOTE — Progress Notes (Signed)
 Chief Complaint: Chief Complaint  Patient presents with  . Right Shoulder - Pain   Marcia Blake is a 73 y.o. female who presents today for repeat evaluation of ongoing right shoulder pain.  The patient was last evaluated by me in January 2024, at this visit the patient was diagnosed with right rotator cuff tendinitis in addition to underlying biceps tendinopathy and impingement syndrome.  The patient did receive a right subacromial steroid injection which did provide moderate relief for several months.  She states that several months ago without any trauma or injury she developed increased right shoulder discomfort.  The patient reports that she did undergo an MRI scan for another object and in doing so she had to lift her arm above her head there she reports moderate discomfort when bringing the shoulder back down to her side.  She denies any numbness or ting into the right upper extremity at today's visit.  The patient reports a 7 out of 10 pain score.  She has not noticed any loss of range of motion but she does report discomfort when attempting to reach above her head and out to the side.  The patient does report increased pain at night when attempting to sleep on the right side as well.  She is not taking any medication consistently for the right shoulder, she states that on occasion she will take Tylenol for discomfort.  Past Medical History: Past Medical History:  Diagnosis Date  . Chronic kidney disease   . GERD (gastroesophageal reflux disease) Approx. 2011  . 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 History of cataract 2013   Both eyes  . Hyperlipidemia Approx. 2015  . Hypertension 09/12/2019   Headaches & Dizziness  . Nephrolithiasis   . Type 2 diabetes mellitus without complication, without long-term current use of insulin (CMS/HHS-HCC) 05/13/2023    Past Surgical History: Past Surgical History:  Procedure Laterality Date  . TUBAL LIGATION  1983  .  HYSTERECTOMY  1997  . CATARACT EXTRACTION  2013   Both eyes  . MASTECTOMY  2018   Bilateral  . COLONOSCOPY  01/2019    Past Family History: Family History  Problem Relation Age of Onset  . Alcohol abuse Brother        Deceased  . Anxiety Sister   . Breast cancer Sister   . Colon cancer Mother        Deceased  . Colon polyps Sister   . Glaucoma Father        Deceased  . Prostate cancer Paternal Uncle        Deceased    Medications: Current Outpatient Medications  Medication Sig Dispense Refill  . acetaminophen (TYLENOL) 500 MG tablet Take 500 mg by mouth every 6 (six) hours as needed    . aspirin 81 MG EC tablet Take 1 tablet (81 mg total) by mouth once daily 30 tablet 11  . atorvastatin (LIPITOR) 20 MG tablet TAKE 1 TABLET BY MOUTH EVERYDAY AT BEDTIME (Patient not taking: Reported on 05/13/2023) 90 tablet 3  . biotin 5 mg Tab Take 5,000 mcg by mouth once daily    . cholecalciferol (VITAMIN D3) 2,000 unit tablet Take 2,000 Units by mouth once daily    . multivitamin capsule Take 1 capsule by mouth once daily    . triamcinolone  0.1 % ointment Apply 1 Application  topically once daily as needed     No current facility-administered medications for this  visit.    Allergies: Allergies  Allergen Reactions  . Penicillins Rash  . Sulfa (Sulfonamide Antibiotics) Rash  . Sulfur (Not Sulfa) Rash     Review of Systems:  A comprehensive 14 point ROS was performed, reviewed by me today, and the pertinent orthopaedic findings are documented in the HPI.   Exam: BP 122/62   Ht 158.8 cm (5' 2.5)   Wt 78.2 kg (172 lb 6.4 oz)   BMI 31.03 kg/m  General/Constitutional: The patient appears to be well-nourished, well-developed, and in no acute distress. Neuro/Psych: Normal mood and affect, oriented to person, place and time. Eyes: Non-icteric.  Pupils are equal, round, and reactive to light, and exhibit synchronous movement. ENT: Unremarkable. Lymphatic: No palpable  adenopathy. Respiratory: Non-labored breathing Cardiovascular: No edema, swelling or tenderness, except as noted in detailed exam. Integumentary: No impressive skin lesions present, except as noted in detailed exam. Musculoskeletal: Unremarkable, except as noted in detailed exam.  General: Well developed, well nourished 73 y.o. female in no apparent distress.  Normal affect.  Normal communication.  Patient answers questions appropriately.  The patient has a normal gait.  There is no antalgic component.  There is no hip lurch.   Cranial Nerves: Pupils equal round and reactive to light.  Facial tone is symmetric.  Facial sensation is symmetric. Shoulder shrug is symmetric. Tongue protrusion is midline.  There is no pronator drift.  ROM of spine: The patient does have full range of motion of the cervical spine without significant discomfort today's visit.  She is nontender palpation over the vertebral bodies.  She does report some discomfort palpation along the right paravertebral musculature.  Skin examination of the right shoulder demonstrates no open wound, erythema or ecchymosis.  The patient is able to forward flex up to 120 degrees, full passive forward flexion.  Abduction 130 degrees with full passive abduction.  With the right arm abducted 90 degrees she can tolerate external rotation 85 degrees, internal rotation 70 degrees.  With the arm at her side she has 4/5 strength with resisted internal and external rotation.  She does have a positive speeds test.  Positive impingement test, negative Hawkin's test at today's visit.  She is able to reach behind her back to the level of the L1 vertebrae, discomfort with liftoff testing.  Negative drop arm test.  Negative sulcus sign to the right shoulder.  The patient does have increased pain with resisted right shoulder abduction at today's visit.  Reflexes are 2+ and symmetric at the biceps, triceps, brachioradialis.   Bilateral upper and lower extremity  sensation is intact to light touch.  Clonus is not present.  Toes are down-going.  Gait is normal.  No difficulty with tandem gait.  Hoffman's is absent.  Rapid alternating movements are normal.   Vascular: The patient has less than 2 second capillary refill.  The patient has normal ulnar and radial pulses.  The patient has normal warmth to touch.    Imaging: True AP, Y-scapular, and axillary views of the right shoulder were obtained at a previous visit reviewed today.  These films demonstrate mild degenerative changes.  The subacromial space is mildly decreased.  There is no subacromial or infra-clavicular spurring.  She demonstrates a Type II acromion.  Impression: Right rotator cuff tendonitis [M75.81] Right rotator cuff tendonitis  (primary encounter diagnosis) Biceps tendonitis on right Impingement syndrome of right shoulder Type 2 diabetes mellitus without complication, without long-term current use of insulin (CMS/HHS-HCC)  Plan:  1.  Treatment options  were discussed today with the patient. 2.  At today's visit the patient is still experiencing pain when reaching above shoulder level both in forward flexion and abduction.  She does have intact strength however. 3.  We discussed proceeding with MRI scan of the right shoulder as she has been experiencing off-and-on right shoulder pain over the past 2 years at this time however the patient does not wish to consider any surgical intervention at this time. 4.  The patient was offered and received a right subacromial steroid injection today's visit.  She was given some exercises to perform at home on a routine basis. 5.  She will follow-up with me in 6 weeks for repeat evaluation of the right shoulder.  They can call the clinic they have any questions, new symptoms develop or symptoms worsen.  Subacromial shoulder injection:  The risks, benefits, and alternatives of the proposed injection were discussed with patient.  The patient states  that she understands these and she verbally consents to proceed.  A surgical time out was performed.  The patient's right lateral shoulder was cleaned with an alcohol prep.  Topical skin anesthesia was obtained using ethyl chloride spray.  Using aseptic technique, the subacromial space was injected with a solution of 1 cc of Kenalog -40 (40 mg) and 4 cc of 0.5% Marcaine.  The patient tolerated the procedure well and without any complications.  The patient was counseled as to the expected post-injection course, including the possibility of temporary worsening of symptoms.  She also was counseled as to concerning symptoms or signs, and was instructed to contact our office if any of these should develop.  This office visit took 45 minutes, of which >50% involved patient counseling/education.  Review of the [place] CSRS was performed in accordance of the [facility] prior to dispensing any controlled drugs.  This note was generated in part with voice recognition software and I apologize for any typographical errors that were not detected and corrected.  DOROTHA Gustavo Level, PA-C, CAQ-OS [facility]

## 2023-09-23 ENCOUNTER — Inpatient Hospital Stay: Payer: Medicare Other | Attending: Oncology

## 2023-09-23 ENCOUNTER — Other Ambulatory Visit: Payer: Medicare Other

## 2023-09-23 DIAGNOSIS — Z853 Personal history of malignant neoplasm of breast: Secondary | ICD-10-CM | POA: Insufficient documentation

## 2023-09-23 DIAGNOSIS — D3A092 Benign carcinoid tumor of the stomach: Secondary | ICD-10-CM

## 2023-09-23 DIAGNOSIS — Z9013 Acquired absence of bilateral breasts and nipples: Secondary | ICD-10-CM | POA: Diagnosis not present

## 2023-09-23 DIAGNOSIS — Z86012 Personal history of benign carcinoid tumor: Secondary | ICD-10-CM | POA: Insufficient documentation

## 2023-09-23 LAB — CBC WITH DIFFERENTIAL/PLATELET
Abs Immature Granulocytes: 0.02 10*3/uL (ref 0.00–0.07)
Basophils Absolute: 0.1 10*3/uL (ref 0.0–0.1)
Basophils Relative: 1 %
Eosinophils Absolute: 0.4 10*3/uL (ref 0.0–0.5)
Eosinophils Relative: 6 %
HCT: 37.2 % (ref 36.0–46.0)
Hemoglobin: 12.1 g/dL (ref 12.0–15.0)
Immature Granulocytes: 0 %
Lymphocytes Relative: 36 %
Lymphs Abs: 2.4 10*3/uL (ref 0.7–4.0)
MCH: 29.7 pg (ref 26.0–34.0)
MCHC: 32.5 g/dL (ref 30.0–36.0)
MCV: 91.4 fL (ref 80.0–100.0)
Monocytes Absolute: 0.5 10*3/uL (ref 0.1–1.0)
Monocytes Relative: 7 %
Neutro Abs: 3.4 10*3/uL (ref 1.7–7.7)
Neutrophils Relative %: 50 %
Platelets: 281 10*3/uL (ref 150–400)
RBC: 4.07 MIL/uL (ref 3.87–5.11)
RDW: 11.9 % (ref 11.5–15.5)
WBC: 6.9 10*3/uL (ref 4.0–10.5)
nRBC: 0 % (ref 0.0–0.2)

## 2023-09-23 LAB — COMPREHENSIVE METABOLIC PANEL WITH GFR
ALT: 14 U/L (ref 0–44)
AST: 17 U/L (ref 15–41)
Albumin: 4.1 g/dL (ref 3.5–5.0)
Alkaline Phosphatase: 66 U/L (ref 38–126)
Anion gap: 11 (ref 5–15)
BUN: 16 mg/dL (ref 8–23)
CO2: 26 mmol/L (ref 22–32)
Calcium: 9.5 mg/dL (ref 8.9–10.3)
Chloride: 101 mmol/L (ref 98–111)
Creatinine, Ser: 0.77 mg/dL (ref 0.44–1.00)
GFR, Estimated: >60 mL/min (ref >60)
Glucose, Bld: 99 mg/dL (ref 70–99)
Potassium: 3.9 mmol/L (ref 3.5–5.1)
Sodium: 138 mmol/L (ref 135–145)
Total Bilirubin: 0.5 mg/dL (ref 0.0–1.2)
Total Protein: 7.6 g/dL (ref 6.5–8.1)

## 2023-09-25 LAB — CHROMOGRANIN A: Chromogranin A (ng/mL): 329.6 ng/mL — ABNORMAL HIGH (ref 0.0–101.8)

## 2023-10-01 ENCOUNTER — Ambulatory Visit: Payer: Medicare Other | Admitting: Urology

## 2023-10-02 ENCOUNTER — Ambulatory Visit: Payer: Self-pay | Admitting: Urology

## 2023-10-08 ENCOUNTER — Encounter: Payer: Self-pay | Admitting: Oncology

## 2023-10-08 DIAGNOSIS — D3A010 Benign carcinoid tumor of the duodenum: Secondary | ICD-10-CM

## 2023-10-08 DIAGNOSIS — R1011 Right upper quadrant pain: Secondary | ICD-10-CM

## 2023-10-09 NOTE — Addendum Note (Signed)
 Addended by: Dilan Fullenwider on: 10/09/2023 10:41 AM   Modules accepted: Orders

## 2023-10-09 NOTE — Telephone Encounter (Signed)
 Ct abdomen pelvis with contrast in the next 1-2 weeks

## 2023-10-14 ENCOUNTER — Other Ambulatory Visit: Payer: Self-pay

## 2023-10-14 DIAGNOSIS — N200 Calculus of kidney: Secondary | ICD-10-CM

## 2023-10-15 ENCOUNTER — Encounter: Payer: Self-pay | Admitting: Urology

## 2023-10-15 ENCOUNTER — Ambulatory Visit
Admission: RE | Admit: 2023-10-15 | Discharge: 2023-10-15 | Disposition: A | Discharge status: Home / Self Care | Attending: Urology | Admitting: Urology

## 2023-10-15 ENCOUNTER — Ambulatory Visit
Admission: RE | Admit: 2023-10-15 | Discharge: 2023-10-15 | Disposition: A | Discharge status: Home / Self Care | Source: Ambulatory Visit | Attending: Urology

## 2023-10-15 ENCOUNTER — Ambulatory Visit: Admitting: Urology

## 2023-10-15 VITALS — BP 125/80 | Ht 62.5 in | Wt 170.0 lb

## 2023-10-15 DIAGNOSIS — R102 Pelvic and perineal pain: Secondary | ICD-10-CM

## 2023-10-15 DIAGNOSIS — N200 Calculus of kidney: Secondary | ICD-10-CM

## 2023-10-15 NOTE — Progress Notes (Signed)
   10/15/2023 10:27 AM   Marcia Blake 11/28/1950 [id number]  Reason for visit: Follow up nephrolithiasis, groin pain  HPI: 73 year old female with history of neuroendocrine tumor status post bowel resection who has had 3 prior stone episodes.  1 of these required shockwave lithotripsy in 2018 at [facility], and stone type was calcium oxalate.    She denies any stone episodes over the last year.  She has some intermittent lower groin pain of unclear etiology.  She denies any gross hematuria or dysuria.  Recent urinalysis contaminated with squamous cells but benign.  I personally viewed and interpreted the CT from November 2024 showing no evidence of hydronephrosis or stones, normal-appearing bladder, as well as the KUB today with no evidence of stones.  We discussed general stone prevention strategies including adequate hydration with goal of producing 2.5 L of urine daily, increasing citric acid intake, increasing calcium intake during high oxalate meals, minimizing animal protein, and decreasing salt intake. Information about dietary recommendations given today.   Pelvic floor stretching exercises provided for groin pain, reassurance provided regarding normal imaging with no evidence of stones RTC 1 year PA, KUB prior   Lawerence Pressman, MD    [facility] [address] [place], [place] [address] [phone]

## 2023-10-15 NOTE — Patient Instructions (Signed)
 Marcia Blake

## 2023-10-22 ENCOUNTER — Ambulatory Visit
Admission: RE | Admit: 2023-10-22 | Discharge: 2023-10-22 | Disposition: A | Discharge status: Home / Self Care | Source: Ambulatory Visit | Attending: Oncology | Admitting: Oncology

## 2023-10-22 DIAGNOSIS — D3A010 Benign carcinoid tumor of the duodenum: Secondary | ICD-10-CM | POA: Insufficient documentation

## 2023-10-22 MED ORDER — IOHEXOL 300 MG/ML  SOLN
100.0000 mL | Freq: Once | INTRAMUSCULAR | Status: AC | PRN
  Administered 2023-10-22: 100 mL via INTRAVENOUS

## 2023-10-31 ENCOUNTER — Ambulatory Visit: Payer: Self-pay | Admitting: Oncology

## 2023-10-31 NOTE — Telephone Encounter (Signed)
-----   Message from Avonne Boettcher sent at 10/31/2023  1:07 PM EDT ----- Let her know that her CT scan shows no evidence of recurrent cancer ----- Message ----- From: Interface, Rad Results In Sent: 10/30/2023   6:27 PM EDT To: Avonne Boettcher, MD

## 2023-10-31 NOTE — Telephone Encounter (Signed)
 Per Dr. Randy Buttery Let her know that her CT scan shows no evidence of recurrent cancer. Outbound call; informed of above.  Patient had additional questions / concerns she said regarding 5 things noted but did not specify the verbiage specifically.  Informed patient she can send us  a my chart message with the additional notations she would like clarification on; patient verbalized understanding.

## 2023-11-25 ENCOUNTER — Other Ambulatory Visit: Payer: Medicare Other

## 2023-11-25 ENCOUNTER — Ambulatory Visit: Payer: Medicare Other | Admitting: Oncology

## 2024-01-08 ENCOUNTER — Encounter: Payer: Self-pay | Admitting: Dermatology

## 2024-01-08 ENCOUNTER — Ambulatory Visit: Payer: Medicare Other | Admitting: Dermatology

## 2024-01-08 DIAGNOSIS — L905 Scar conditions and fibrosis of skin: Secondary | ICD-10-CM

## 2024-01-08 DIAGNOSIS — W908XXA Exposure to other nonionizing radiation, initial encounter: Secondary | ICD-10-CM | POA: Diagnosis not present

## 2024-01-08 DIAGNOSIS — L578 Other skin changes due to chronic exposure to nonionizing radiation: Secondary | ICD-10-CM | POA: Diagnosis not present

## 2024-01-08 DIAGNOSIS — D1801 Hemangioma of skin and subcutaneous tissue: Secondary | ICD-10-CM

## 2024-01-08 DIAGNOSIS — D239 Other benign neoplasm of skin, unspecified: Secondary | ICD-10-CM

## 2024-01-08 DIAGNOSIS — L821 Other seborrheic keratosis: Secondary | ICD-10-CM

## 2024-01-08 DIAGNOSIS — Z1283 Encounter for screening for malignant neoplasm of skin: Secondary | ICD-10-CM

## 2024-01-08 DIAGNOSIS — D2371 Other benign neoplasm of skin of right lower limb, including hip: Secondary | ICD-10-CM

## 2024-01-08 DIAGNOSIS — L814 Other melanin hyperpigmentation: Secondary | ICD-10-CM | POA: Diagnosis not present

## 2024-01-08 DIAGNOSIS — D229 Melanocytic nevi, unspecified: Secondary | ICD-10-CM

## 2024-01-08 DIAGNOSIS — D2362 Other benign neoplasm of skin of left upper limb, including shoulder: Secondary | ICD-10-CM

## 2024-01-08 NOTE — Progress Notes (Signed)
 Follow-Up Visit   Subjective  Marcia Blake is a 73 y.o. female who presents for the following: Skin Cancer Screening and Full Body Skin Exam. Spots at left inframammary that itch at times, has also noticed some places at back. Concerned for place at left scalp that she did scratch off.   The patient presents for Total-Body Skin Exam (TBSE) for skin cancer screening and mole check. The patient has spots, moles and lesions to be evaluated, some may be new or changing and the patient may have concern these could be cancer.   The following portions of the chart were reviewed this encounter and updated as appropriate: medications, allergies, medical history  Review of Systems:  No other skin or systemic complaints except as noted in HPI or Assessment and Plan.  Objective  Well appearing patient in no apparent distress; mood and affect are within normal limits.  A full examination was performed including scalp, head, eyes, ears, nose, lips, neck, chest, axillae, abdomen, back, buttocks, bilateral upper extremities, bilateral lower extremities, hands, feet, fingers, toes, fingernails, and toenails. All findings within normal limits unless otherwise noted below.  Exam of nails limited by presence of nail polish.   Relevant physical exam findings are noted in the Assessment and Plan.    Assessment & Plan   SKIN CANCER SCREENING PERFORMED TODAY.  ACTINIC DAMAGE - Chronic condition, secondary to cumulative UV/sun exposure - diffuse scaly erythematous macules with underlying dyspigmentation - Recommend daily broad spectrum sunscreen SPF 30+ to sun-exposed areas, reapply every 2 hours as needed.  - Staying in the shade or wearing long sleeves, sun glasses (UVA+UVB protection) and wide brim hats (4-inch brim around the entire circumference of the hat) are also recommended for sun protection.  - Call for new or changing lesions.  LENTIGINES, SEBORRHEIC KERATOSES, HEMANGIOMAS - Benign  normal skin lesions - Benign-appearing - Call for any changes  SEBORRHEIC KERATOSIS - Stuck-on, waxy, tan-brown papules and/or plaques at left scalp, left inframammary, right lower abdomen, right flank.  - Benign-appearing - Discussed benign etiology and prognosis. - Observe - Call for any changes -Recommend using Cerave Anti-Itch for itching. Patient given samples.   MELANOCYTIC NEVI - Tan-brown and/or pink-flesh-colored symmetric macules and papules - Benign appearing on exam today - Observation - Call clinic for new or changing moles - Recommend daily use of broad spectrum spf 30+ sunscreen to sun-exposed areas.   DERMATOFIBROMA Exam: Firm pink/brown papulenodule with dimple sign at right medial distal thigh and left shoulder.  Treatment Plan: A dermatofibroma is a benign growth possibly related to trauma, such as an insect bite, cut from shaving, or inflamed acne-type bump.  Treatment options to remove include shave or excision with resulting scar and risk of recurrence.  Since benign-appearing and not bothersome, will observe for now.   SCAR Exam: Dyspigmented smooth macule or patch at back.  Benign-appearing.  Observation.  Call clinic for new or changing lesions. Recommend daily broad spectrum sunscreen SPF 30+, reapply every 2 hours as needed. Treatment: Recommend Serica moisturizing scar formula cream every night or [employer] brand or Mederma silicone scar sheet every night for the first year after a scar appears to help with scar remodeling if desired. Scars remodel on their own for a full year and will gradually improve in appearance over time.     MULTIPLE BENIGN NEVI   LENTIGINES   ACTINIC ELASTOSIS   SEBORRHEIC KERATOSES   CHERRY ANGIOMA   DERMATOFIBROMA   Return in about 1 year (around  01/07/2025) for TBSE, w/ Dr. Claudene.  I, Jacquelynn V. Wilfred, CMA, am acting as scribe for Boneta Claudene, MD.   Documentation: I have reviewed the above  documentation for accuracy and completeness, and I agree with the above.  Boneta Claudene, MD

## 2024-01-08 NOTE — Patient Instructions (Signed)

## 2024-02-20 ENCOUNTER — Other Ambulatory Visit: Payer: Self-pay | Admitting: Gastroenterology

## 2024-02-20 DIAGNOSIS — R140 Abdominal distension (gaseous): Secondary | ICD-10-CM

## 2024-02-20 DIAGNOSIS — K8020 Calculus of gallbladder without cholecystitis without obstruction: Secondary | ICD-10-CM

## 2024-02-20 DIAGNOSIS — R1013 Epigastric pain: Secondary | ICD-10-CM

## 2024-02-24 ENCOUNTER — Ambulatory Visit: Admitting: Physical Therapy

## 2024-02-24 ENCOUNTER — Ambulatory Visit: Attending: Plastic Surgery

## 2024-02-24 DIAGNOSIS — M546 Pain in thoracic spine: Secondary | ICD-10-CM | POA: Diagnosis present

## 2024-02-24 DIAGNOSIS — M6281 Muscle weakness (generalized): Secondary | ICD-10-CM | POA: Diagnosis present

## 2024-02-24 NOTE — Therapy (Signed)
 OUTPATIENT PHYSICAL THERAPY THORACOLUMBAR EVALUATION   Patient Name: Marcia Blake MRN: [id number] DOB:[birth year], 73 y.o., female Today's Date: 02/24/2024  END OF SESSION:  PT End of Session - 02/24/24 0900     Visit Number 1    Number of Visits 17    Date for Recertification  04/20/24    PT Start Time 0900    PT Stop Time 0941    PT Time Calculation (min) 41 min    Activity Tolerance Patient tolerated treatment well    Behavior During Therapy [facility] for tasks assessed/performed          Past Medical History:  Diagnosis Date   Cancer (HCC) [id number]   breast-rt.   Diabetes mellitus without complication (HCC)    History of kidney stones    Past Surgical History:  Procedure Laterality Date   ABDOMINAL HYSTERECTOMY     bilateral mastectomies Bilateral    BREAST SURGERY     CATARACT EXTRACTION Bilateral    COLONOSCOPY WITH PROPOFOL  N/A 02/04/2019   Procedure: COLONOSCOPY WITH PROPOFOL ;  Surgeon: Unk Corinn Skiff, MD;  Location: [facility] ENDOSCOPY;  Service: Gastroenterology;  Laterality: N/A;   duodenal carcinoid tumor resection     ESOPHAGOGASTRODUODENOSCOPY (EGD) WITH PROPOFOL  N/A 10/24/2022   Procedure: ESOPHAGOGASTRODUODENOSCOPY (EGD) WITH PROPOFOL ;  Surgeon: Unk Corinn Skiff, MD;  Location: [facility] ENDOSCOPY;  Service: Gastroenterology;  Laterality: N/A;   feet surgery Bilateral    US  KOMAN RT BREAST ([facility] HX) Right    Patient Active Problem List   Diagnosis Date Noted   RUQ abdominal pain 10/24/2022   Other gastritis with bleeding 10/24/2022   Duodenitis 10/24/2022   Dyspepsia 10/15/2019   Gastrointestinal symptoms 10/15/2019   History of adenomatous polyp of colon 10/15/2019   History of Helicobacter pylori infection 10/15/2019   Lactose intolerance 10/15/2019   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   Cervical radiculopathy 10/14/2019   Dizziness 10/14/2019   Encounter for screening colonoscopy    Family history of colon cancer in mother    History  of mastectomy 03/30/2017   History of genetic counseling 02/03/2017   History of right breast cancer 07/03/2016   GERD (gastroesophageal reflux disease) 05/02/2016   Hyperlipidemia, unspecified 05/02/2016   Malignant neoplasm of female breast (HCC) 05/02/2016   Right kidney stone 05/02/2016   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 Typically just beneath her breasts and all the way around. She states it's aggravated when she stands for long periods of time and tightness when she sits in certain positions.    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 No issues with her breasts and no lumps or lesions. 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 I reviewed good supporting chairs and will also refer her to PT. We will see her again in a year or sooner if needed. She was also seen by orthopedic about R hip pain where she was diagnosed with R greater trochanteric bursitis with moderate pain to palpation along R greater trochanteric bursa.   She underwent bilateral mastectomy and breast reconstruction with latissimus flaps with implants 7 years ago.   PAIN:  Are you  having pain? No - she reports it is mostly just tightness vs pain  PRECAUTIONS: None  RED FLAGS: None   WEIGHT BEARING RESTRICTIONS: No  FALLS:  Has patient fallen in last 6 months? No  LIVING ENVIRONMENT: Lives with: lives with their daughter Lives in: House/apartment  OCCUPATION: retired   PLOF: Independent  PATIENT GOALS: to be able to see if there is something to relieve some of the tightness.    OBJECTIVE:  Note: Objective measures were completed at Evaluation unless otherwise noted.  DIAGNOSTIC FINDINGS:  N/A   PATIENT SURVEYS:  Modified Oswestry:  MODIFIED OSWESTRY DISABILITY SCALE  Date: 02/24/2024 Score  Pain intensity 0 = I can tolerate the pain I have without having to use pain medication.  2. Personal care (washing, dressing, etc.) 0 =  I can take care of myself normally without causing increased pain.  3. Lifting 1 = I can lift heavy weights, but it causes increased pain.  4. Walking 0 = Pain does not prevent me from walking any distance  5. Sitting 3 =  Pain prevents me from sitting more than  hour.  6. Standing 1 =  I can stand as long as I want but, it increases my pain.  7. Sleeping 0 = Pain does not prevent me from sleeping well.  8. Social Life 0 = My social life is normal and does not increase my pain.  9. Traveling 1 =  I can travel anywhere, but it increases my pain.  10. Employment/ Homemaking 1 = My normal homemaking/job activities increase my pain, but I can still perform all that is required of me  Total 7/50 = 14%   Interpretation of scores: Score Category Description  0-20% Minimal Disability The patient can cope with most living activities. Usually no treatment is indicated apart from advice on lifting, sitting and exercise  21-40% Moderate Disability The patient experiences more pain and difficulty with sitting, lifting and standing. Travel and social life are more difficult and they may be disabled from work. Personal care, sexual  activity and sleeping are not grossly affected, and the patient can usually be managed by conservative means  41-60% Severe Disability Pain remains the main problem in this group, but activities of daily living are affected. These patients require a detailed investigation  61-80% Crippled Back pain impinges on all aspects of the patient's life. Positive intervention is required  81-100% Bed-bound These patients are either bed-bound or exaggerating their symptoms  Bluford FORBES Zoe DELENA Karon DELENA, et al. Surgery versus conservative management of stable thoracolumbar fracture: the PRESTO feasibility RCT. [place] ([place]): [employer]; 2021 Nov. [facility] [employer] Assessment, No. 25.62.) Appendix 3, Oswestry Disability Index category descriptors. Available from: Find[employer]el[employer]cz  Minimally Clinically Important Difference (MCID) = 12.8%  COGNITION: Overall cognitive status: Within functional limits for tasks assessed     SENSATION: WFL   POSTURE: rounded shoulders and forward head  PALPATION: Tightness notable to latissimus dorsi (R>L), thoracic paraspinals (R>L), and rhomboids  LUMBAR ROM:   AROM eval  Flexion WFL   Extension WFL   Right lateral flexion WFL   Left lateral flexion WFL   Right rotation WFL   Left rotation WFL    (Blank rows = not tested)  LOWER EXTREMITY ROM:     WFL   LOWER EXTREMITY MMT:    MMT Right eval Left eval  Hip flexion 4+ 4+  Hip extension    Hip abduction 4+ 4+  Hip adduction 4+ 4+  Hip internal rotation    Hip external rotation    Knee flexion 4+ 4+  Knee extension 4+ 4+  Ankle dorsiflexion 4+ 4+  Ankle plantarflexion    Ankle inversion    Ankle eversion     (Blank rows = not tested)  FUNCTIONAL TESTS:  5 times sit to stand: to be completed visit #2   GAIT: Distance walked: 66'  Assistive device utilized: None Level of assistance: Complete Independence Comments: no significant gait deviations    TREATMENT DATE: 02/24/24                                                                                                                                 PATIENT EDUCATION:  Education details: HEP, POC, goals  Person educated: Patient Education method: Explanation, Demonstration, and Handouts Education comprehension: verbalized understanding and returned demonstration  HOME EXERCISE PROGRAM: Access Code: [email or web address] URL: [email or web address] Date: 02/24/2024 Prepared by: Maryanne Finder  Exercises - TL Sidebending Stretch - Single Arm Overhead  - 1-2 x daily - 5-7 x weekly - 3 reps - 30 seconds hold - Latissimus Dorsi Stretch at Wall  - 2-3 x daily - 5-7 x weekly - 3 reps - 30 seconds hold - Sidelying Thoracic Rotation with Open Book  - 2-3 x daily - 5-7 x weekly - 1-2 sets - 10 reps - 3-5 second hold - Supine Lower Trunk Rotation  - 2-3 x daily - 5-7 x weekly - 1-2 sets - 10 reps - 3-5 seconds hold  ASSESSMENT:  CLINICAL IMPRESSION: Patient is a 73 y.o. female who was seen today for physical therapy evaluation and treatment for back pain related to breast reconstruction 7 years ago. Patient demonstrated latissimus dorsi and upper thoracic muscular tightness. This discomfort from the tightness limits her ability to sit for long periods of time as well as  lift heavier objects in the home. HEP initiated with focus on stretching of latissimus dorsi, lower trunk, and thoracic musculature. Patient stated feeling improvement after demonstrations in muscular tightness that was her chief complaint. Patient will benefit from skilled PT services to address listed impairments to improve quality of life and reduce discomfort.    OBJECTIVE IMPAIRMENTS: decreased activity tolerance, decreased ROM, decreased strength, impaired flexibility, postural dysfunction, and pain.   ACTIVITY LIMITATIONS: lifting, sitting, and sleeping  PARTICIPATION LIMITATIONS: cleaning, community activity, and  yard work  PERSONAL FACTORS: Age, Past/current experiences, and Time since onset of injury/illness/exacerbation are also affecting patient's functional outcome.   REHAB POTENTIAL: Good  CLINICAL DECISION MAKING: Stable/uncomplicated  EVALUATION COMPLEXITY: Low   GOALS: Goals reviewed with patient? Yes  SHORT TERM GOALS: Target date: 03/23/2024  Patient will be independent in HEP to improve strength/mobility for better functional independence with ADLs. Baseline: 02/24/24: HEP initiated  Goal status: INITIAL   LONG TERM GOALS: Target date: 04/20/2024  Patient will reduce modified Oswestry score by 4 points as to demonstrate improvement in ability to complete ADLs including improved sleeping tolerance, walking/sitting tolerance etc for better mobility with ADLs.  Baseline: 02/24/24: 7/50 = 14% Goal status: INITIAL  2.  Patient will report being able to sit >45 minutes without discomfort or tightness in mid back and under breasts to demonstrate improvement in muscle tightness and tolerance to sitting. Baseline: 02/24/24: unable to sit >30 minutes  Goal status: INITIAL  3.  Patient will improve five times sit to stand test time by 3 seconds indicating an increased LE strength and improved balance. Baseline: 02/24/24: to be completed visit #2  Goal status: INITIAL  4.  Patient will increase BLE gross strength by 1/3 MMT grade as to improve functional strength for independent gait, increased standing tolerance and increased ADL ability. Baseline: 02/24/24: see above.  Goal status: INITIAL  PLAN:  PT FREQUENCY: 1-2x/week  PT DURATION: 8 weeks  PLANNED INTERVENTIONS: 97164- PT Re-evaluation, 97750- Physical Performance Testing, 97110-Therapeutic exercises, 97530- Therapeutic activity, [id number]- Neuromuscular re-education, 97535- Self Care, [id number]- Manual therapy, [id number]- Electrical stimulation (unattended), [phone]- Electrical stimulation (manual), [phone] (1-2 muscles), [id number] (3+  muscles)- Dry Needling, Patient/Family education, Joint mobilization, Joint manipulation, Spinal manipulation, Spinal mobilization, Cryotherapy, and Moist heat.  PLAN FOR NEXT SESSION: 5xSTS, thoracic/lumbar/lats stretch, core/BLE strengthening   Maryanne Finder, PT, DPT Physical Therapist - [place]  [facility] 02/24/2024, 11:59 AM

## 2024-03-09 ENCOUNTER — Ambulatory Visit

## 2024-03-09 DIAGNOSIS — M6281 Muscle weakness (generalized): Secondary | ICD-10-CM

## 2024-03-09 DIAGNOSIS — M546 Pain in thoracic spine: Secondary | ICD-10-CM

## 2024-03-09 NOTE — Therapy (Signed)
 OUTPATIENT PHYSICAL THERAPY THORACOLUMBAR TREATMENT   Patient Name: Marcia Blake MRN: [id number] DOB:01/29/1951, 73 y.o., female Today's Date: 03/09/2024  END OF SESSION:  PT End of Session - 03/09/24 1432     Visit Number 2    Number of Visits 17    Date for Recertification  04/20/24    PT Start Time 1433    PT Stop Time 1515    PT Time Calculation (min) 42 min    Activity Tolerance Patient tolerated treatment well    Behavior During Therapy [facility] for tasks assessed/performed          Past Medical History:  Diagnosis Date   Cancer (HCC) [phone]   breast-rt.   Diabetes mellitus without complication (HCC)    History of kidney stones    Past Surgical History:  Procedure Laterality Date   ABDOMINAL HYSTERECTOMY     bilateral mastectomies Bilateral    BREAST SURGERY     CATARACT EXTRACTION Bilateral    COLONOSCOPY WITH PROPOFOL  N/A 02/04/2019   Procedure: COLONOSCOPY WITH PROPOFOL ;  Surgeon: Unk Corinn Skiff, MD;  Location: [facility] ENDOSCOPY;  Service: Gastroenterology;  Laterality: N/A;   duodenal carcinoid tumor resection     ESOPHAGOGASTRODUODENOSCOPY (EGD) WITH PROPOFOL  N/A 10/24/2022   Procedure: ESOPHAGOGASTRODUODENOSCOPY (EGD) WITH PROPOFOL ;  Surgeon: Unk Corinn Skiff, MD;  Location: [facility] ENDOSCOPY;  Service: Gastroenterology;  Laterality: N/A;   feet surgery Bilateral    US  KOMAN RT BREAST ([facility] HX) Right    Patient Active Problem List   Diagnosis Date Noted   RUQ abdominal pain 10/24/2022   Other gastritis with bleeding 10/24/2022   Duodenitis 10/24/2022   Dyspepsia 10/15/2019   Gastrointestinal symptoms 10/15/2019   History of adenomatous polyp of colon 10/15/2019   History of Helicobacter pylori infection 10/15/2019   Lactose intolerance 10/15/2019   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   Cervical radiculopathy 10/14/2019   Dizziness 10/14/2019   Encounter for screening colonoscopy    Family history of colon cancer in mother    History  of mastectomy 03/30/2017   History of genetic counseling 02/03/2017   History of right breast cancer 07/03/2016   GERD (gastroesophageal reflux disease) 05/02/2016   Hyperlipidemia, unspecified 05/02/2016   Malignant neoplasm of female breast (HCC) 05/02/2016   Right kidney stone 05/02/2016   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 Typically just beneath her breasts and all the way around. She states it's aggravated when she stands for long periods of time and tightness when she sits in certain positions.    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 No issues with her breasts and no lumps or lesions. 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 I reviewed good supporting chairs and will also refer her to PT. We will see her again in a year or sooner if needed. She was also seen by orthopedic about R hip pain where she was diagnosed with R greater trochanteric bursitis with moderate pain to palpation along R greater trochanteric bursa.   She underwent bilateral mastectomy and breast reconstruction with latissimus flaps with implants 7 years ago.   PAIN:  Are you  having pain? No - she reports it is mostly just tightness vs pain  PRECAUTIONS: None  RED FLAGS: None   WEIGHT BEARING RESTRICTIONS: No  FALLS:  Has patient fallen in last 6 months? No  LIVING ENVIRONMENT: Lives with: lives with their daughter Lives in: House/apartment  OCCUPATION: retired   PLOF: Independent  PATIENT GOALS: to be able to see if there is something to relieve some of the tightness.    OBJECTIVE:  Note: Objective measures were completed at Evaluation unless otherwise noted.  DIAGNOSTIC FINDINGS:  N/A   PATIENT SURVEYS:  Modified Oswestry:  MODIFIED OSWESTRY DISABILITY SCALE  Date: 02/24/2024 Score  Pain intensity 0 = I can tolerate the pain I have without having to use pain medication.  2. Personal care (washing, dressing, etc.) 0 =  I can take care of myself normally without causing increased pain.  3. Lifting 1 = I can lift heavy weights, but it causes increased pain.  4. Walking 0 = Pain does not prevent me from walking any distance  5. Sitting 3 =  Pain prevents me from sitting more than  hour.  6. Standing 1 =  I can stand as long as I want but, it increases my pain.  7. Sleeping 0 = Pain does not prevent me from sleeping well.  8. Social Life 0 = My social life is normal and does not increase my pain.  9. Traveling 1 =  I can travel anywhere, but it increases my pain.  10. Employment/ Homemaking 1 = My normal homemaking/job activities increase my pain, but I can still perform all that is required of me  Total 7/50 = 14%   Interpretation of scores: Score Category Description  0-20% Minimal Disability The patient can cope with most living activities. Usually no treatment is indicated apart from advice on lifting, sitting and exercise  21-40% Moderate Disability The patient experiences more pain and difficulty with sitting, lifting and standing. Travel and social life are more difficult and they may be disabled from work. Personal care, sexual  activity and sleeping are not grossly affected, and the patient can usually be managed by conservative means  41-60% Severe Disability Pain remains the main problem in this group, but activities of daily living are affected. These patients require a detailed investigation  61-80% Crippled Back pain impinges on all aspects of the patient's life. Positive intervention is required  81-100% Bed-bound These patients are either bed-bound or exaggerating their symptoms  Bluford FORBES Zoe DELENA Karon DELENA, et al. Surgery versus conservative management of stable thoracolumbar fracture: the PRESTO feasibility RCT. [place] ([place]): [employer]; 2021 Nov. [facility] Technology Assessment, No. 25.62.) Appendix 3, Oswestry Disability Index category descriptors. Available from: Findjewelers.cz  Minimally Clinically Important Difference (MCID) = 12.8%  COGNITION: Overall cognitive status: Within functional limits for tasks assessed     SENSATION: WFL   POSTURE: rounded shoulders and forward head  PALPATION: Tightness notable to latissimus dorsi (R>L), thoracic paraspinals (R>L), and rhomboids  LUMBAR ROM:   AROM eval  Flexion WFL   Extension WFL   Right lateral flexion WFL   Left lateral flexion WFL   Right rotation WFL   Left rotation WFL    (Blank rows = not tested)  LOWER EXTREMITY ROM:     WFL   LOWER EXTREMITY MMT:    MMT Right eval Left eval  Hip flexion 4+ 4+  Hip extension    Hip abduction 4+ 4+  Hip adduction 4+ 4+  Hip internal rotation    Hip external rotation    Knee flexion 4+ 4+  Knee extension 4+ 4+  Ankle dorsiflexion 4+ 4+  Ankle plantarflexion    Ankle inversion    Ankle eversion     (Blank rows = not tested)  FUNCTIONAL TESTS:  5 times sit to stand: to be completed visit #2   GAIT: Distance walked: 29'  Assistive device utilized: None Level of assistance: Complete Independence Comments: no significant gait deviations    TREATMENT DATE: 03/09/24                                                                                                                               Subjective: Patient reports 0/10 pain at start of session. Consistent tightness throughout latmissimus dorsi region and upper thoracic region.   Therapeutic Exercise (with intent to strengthening shoulder musculature):   OMEGA:  Seated Lat Pull Down    1 x 10 - 10#    2 x 10 - 15#    Seated Chest Press   3 x 8 - 10#    Standing Shoulder Row    3 x 10 - Blue TB    Seated GHJ Shoulder Abduction    3 x 10 - Red TB    Doorway Pec Stretch for pectorals and latissimus group   30s/bout x 2 in order to improve ROM and tissue extensibility   PT education on proper technique for setup using TB on doorway for shoulder row.  Therapeutic Activity:  AROM Shoulder Flexion with Yard Stick    2 x 10, PT applied OP at end of rep   Standing Reach with DB to various targets   3  x 10 - 4# DB    Seated Physioball Rollout to increase shoulder flexion and latissimus dorsi extensibility    10 fwd rolls,  10 rolls to left    PATIENT EDUCATION:  Education details: HEP, POC, goals  Person educated: Patient Education method: Explanation, Demonstration, and Handouts Education comprehension: verbalized understanding and returned demonstration  HOME EXERCISE PROGRAM: Access Code: [email or web address] URL: [email or web address] Date: 02/24/2024 Prepared by: Maryanne Finder  Exercises - TL Sidebending Stretch - Single Arm Overhead  - 1-2 x daily - 5-7 x weekly - 3 reps - 30 seconds hold - Latissimus Dorsi Stretch at Wall  - 2-3 x daily - 5-7 x weekly - 3 reps - 30 seconds hold - Sidelying Thoracic Rotation with Open Book  - 2-3 x daily - 5-7 x weekly - 1-2 sets - 10 reps - 3-5 second hold - Supine Lower Trunk Rotation  - 2-3 x daily - 5-7 x weekly - 1-2 sets - 10 reps - 3-5 seconds hold  ASSESSMENT:  CLINICAL IMPRESSION: Patient reports to  OPPT for first follow up in management of thoracic/lat pull down related to breast reconstruction. PT focused on strengthening anterior shoulder structures and lengthening latissimus group. Patient tolerated all exercises without exacerbation of pain. New stretches provided to patient in order to increase stretching of thoracic musculature. Less tightness endorsed at the end of the session. Patient still limited with upward reaching and lifting due to stiffness in scapular musculature secondary to breast reconstruction. Patient will continue to benefit from skilled PT services to address listed impairments to improve quality of life and reduce discomfort.     OBJECTIVE IMPAIRMENTS: decreased activity tolerance, decreased ROM, decreased strength, impaired flexibility, postural dysfunction, and pain.   ACTIVITY LIMITATIONS: lifting, sitting, and sleeping  PARTICIPATION LIMITATIONS: cleaning, community activity, and yard work  PERSONAL FACTORS: Age, Past/current experiences, and Time since onset of injury/illness/exacerbation are also affecting patient's functional outcome.   REHAB POTENTIAL: Good  CLINICAL DECISION MAKING: Stable/uncomplicated  EVALUATION COMPLEXITY: Low   GOALS: Goals reviewed with patient? Yes  SHORT TERM GOALS: Target date: 03/23/2024  Patient will be independent in HEP to improve strength/mobility for better functional independence with ADLs. Baseline: 02/24/24: HEP initiated  Goal status: INITIAL   LONG TERM GOALS: Target date: 04/20/2024  Patient will reduce modified Oswestry score by 4 points as to demonstrate improvement in ability to complete ADLs including improved sleeping tolerance, walking/sitting tolerance etc for better mobility with ADLs.  Baseline: 02/24/24: 7/50 = 14% Goal status: INITIAL  2.  Patient will report being able to sit >45 minutes without discomfort or tightness in mid back and under breasts to demonstrate improvement in muscle tightness  and tolerance to sitting. Baseline: 02/24/24: unable to sit >30 minutes  Goal status: INITIAL  3.  Patient will improve five times sit to stand test time by 3 seconds indicating an increased LE strength and improved balance. Baseline: 02/24/24: to be completed visit #2  Goal status: INITIAL  4.  Patient will increase BLE gross strength by 1/3 MMT grade as to improve functional strength for independent gait, increased standing tolerance and increased ADL ability. Baseline: 02/24/24: see above.  Goal status: INITIAL  PLAN:  PT FREQUENCY: 1-2x/week  PT DURATION: 8 weeks  PLANNED INTERVENTIONS: 97164- PT Re-evaluation, 97750- Physical Performance Testing, 97110-Therapeutic exercises, 97530- Therapeutic activity, [id number]- Neuromuscular re-education, 97535- Self Care, 02859- Manual therapy, G0283- Electrical stimulation (unattended), [phone]- Electrical stimulation (manual), [phone] (1-2 muscles), 20561 (3+ muscles)- Dry Needling, Patient/Family education, Joint mobilization, Joint manipulation, Spinal manipulation, Spinal mobilization, Cryotherapy, and Moist heat.  PLAN FOR NEXT SESSION: 5xSTS, thoracic/lumbar/lats stretch, core/BLE strengthening   Lonni Pall PT, DPT  Physical Therapist- [facility]  [facility] 03/09/2024, 2:33 PM

## 2024-03-11 ENCOUNTER — Ambulatory Visit
Admission: RE | Admit: 2024-03-11 | Discharge: 2024-03-11 | Disposition: A | Discharge status: Home / Self Care | Attending: Gastroenterology | Admitting: Gastroenterology

## 2024-03-11 ENCOUNTER — Encounter
Admission: RE | Disposition: A | Discharge status: Home / Self Care | Payer: Self-pay | Source: Home / Self Care | Attending: Gastroenterology

## 2024-03-11 ENCOUNTER — Encounter: Payer: Self-pay | Admitting: Gastroenterology

## 2024-03-11 ENCOUNTER — Ambulatory Visit: Admitting: Anesthesiology

## 2024-03-11 DIAGNOSIS — Z87891 Personal history of nicotine dependence: Secondary | ICD-10-CM | POA: Diagnosis not present

## 2024-03-11 DIAGNOSIS — K644 Residual hemorrhoidal skin tags: Secondary | ICD-10-CM | POA: Insufficient documentation

## 2024-03-11 DIAGNOSIS — K635 Polyp of colon: Secondary | ICD-10-CM | POA: Insufficient documentation

## 2024-03-11 DIAGNOSIS — Z1211 Encounter for screening for malignant neoplasm of colon: Secondary | ICD-10-CM | POA: Diagnosis present

## 2024-03-11 DIAGNOSIS — K5730 Diverticulosis of large intestine without perforation or abscess without bleeding: Secondary | ICD-10-CM | POA: Insufficient documentation

## 2024-03-11 DIAGNOSIS — K219 Gastro-esophageal reflux disease without esophagitis: Secondary | ICD-10-CM | POA: Diagnosis not present

## 2024-03-11 DIAGNOSIS — E119 Type 2 diabetes mellitus without complications: Secondary | ICD-10-CM | POA: Diagnosis not present

## 2024-03-11 HISTORY — PX: COLONOSCOPY: SHX5424

## 2024-03-11 HISTORY — PX: POLYPECTOMY: SHX149

## 2024-03-11 SURGERY — COLONOSCOPY
Anesthesia: General

## 2024-03-11 MED ORDER — GLYCOPYRROLATE 0.2 MG/ML IJ SOLN
INTRAMUSCULAR | Status: DC | PRN
  Administered 2024-03-11: .2 mg via INTRAVENOUS

## 2024-03-11 MED ORDER — PROPOFOL 500 MG/50ML IV EMUL
INTRAVENOUS | Status: DC | PRN
Start: 2024-03-11 — End: 2024-03-11
  Administered 2024-03-11: 165 ug/kg/min via INTRAVENOUS

## 2024-03-11 MED ORDER — SODIUM CHLORIDE 0.9 % IV SOLN: INTRAVENOUS | Status: DC

## 2024-03-11 MED ORDER — PROPOFOL 10 MG/ML IV BOLUS
INTRAVENOUS | Status: DC | PRN
  Administered 2024-03-11: 50 mg via INTRAVENOUS

## 2024-03-11 MED ORDER — PROPOFOL 1000 MG/100ML IV EMUL
INTRAVENOUS | Status: AC
  Filled 2024-03-11: qty 500

## 2024-03-11 MED ORDER — LIDOCAINE HCL (CARDIAC) PF 100 MG/5ML IV SOSY
PREFILLED_SYRINGE | INTRAVENOUS | Status: DC | PRN
  Administered 2024-03-11: 100 mg via INTRAVENOUS

## 2024-03-11 MED ORDER — PHENYLEPHRINE 80 MCG/ML (10ML) SYRINGE FOR IV PUSH (FOR BLOOD PRESSURE SUPPORT)
PREFILLED_SYRINGE | INTRAVENOUS | Status: DC | PRN
  Administered 2024-03-11: 160 ug via INTRAVENOUS

## 2024-03-11 NOTE — Anesthesia Procedure Notes (Signed)
 Procedure Name: General with mask airway Date/Time: 03/11/2024 7:54 AM  Performed by: Ledora Duncan, CRNAPre-anesthesia Checklist: Patient identified, Emergency Drugs available, Suction available and Patient being monitored Patient Re-evaluated:Patient Re-evaluated prior to induction Oxygen Delivery Method: Simple face mask Induction Type: IV induction Placement Confirmation: positive ETCO2 and breath sounds checked- equal and bilateral Dental Injury: Teeth and Oropharynx as per pre-operative assessment

## 2024-03-11 NOTE — Op Note (Signed)
 [facility] Gastroenterology Patient Name: Marcia Blake Procedure Date: 03/11/2024 7:49 AM MRN: [id number] Account #: [id number] Date of Birth: 18-Jun-1950 Admit Type: Outpatient Age: 73 Room: [facility] ENDO ROOM 1 Gender: Female Note Status: Finalized Instrument Name: Peds Colonoscope [id number] Procedure:             Colonoscopy Indications:           Surveillance: Personal history of adenomatous polyps                         on last colonoscopy 5 years ago, Last colonoscopy:                         September 2020 Providers:             Corinn Jess Brooklyn MD, MD Referring MD:          Alm HERO. Epifanio (Referring MD) Medicines:             General Anesthesia Complications:         No immediate complications. Estimated blood loss: None. Procedure:             Pre-Anesthesia Assessment:                        - Prior to the procedure, a History and Physical was                         performed, and patient medications and allergies were                         reviewed. The patient is competent. The risks and                         benefits of the procedure and the sedation options and                         risks were discussed with the patient. All questions                         were answered and informed consent was obtained.                         Patient identification and proposed procedure were                         verified by the physician, the nurse, the                         anesthesiologist, the anesthetist and the technician                         in the pre-procedure area in the procedure room in the                         endoscopy suite. Mental Status Examination: alert and                         oriented. Airway Examination: normal oropharyngeal  airway and neck mobility. Respiratory Examination:                         clear to auscultation. CV Examination: normal.                         Prophylactic  Antibiotics: The patient does not require                         prophylactic antibiotics. Prior Anticoagulants: The                         patient has taken no anticoagulant or antiplatelet                         agents. ASA Grade Assessment: II - A patient with mild                         systemic disease. After reviewing the risks and                         benefits, the patient was deemed in satisfactory                         condition to undergo the procedure. The anesthesia                         plan was to use general anesthesia. Immediately prior                         to administration of medications, the patient was                         re-assessed for adequacy to receive sedatives. The                         heart rate, respiratory rate, oxygen saturations,                         blood pressure, adequacy of pulmonary ventilation, and                         response to care were monitored throughout the                         procedure. The physical status of the patient was                         re-assessed after the procedure.                        After obtaining informed consent, the colonoscope was                         passed under direct vision. Throughout the procedure,                         the patient's blood pressure, pulse, and oxygen  saturations were monitored continuously. The                         Colonoscope was introduced through the anus and                         advanced to the the terminal ileum, with                         identification of the appendiceal orifice and IC                         valve. The colonoscopy was performed without                         difficulty. The patient tolerated the procedure well.                         The quality of the bowel preparation was evaluated                         using the BBPS [facility] Bowel Preparation Scale) with                         scores of: Right  Colon = 3, Transverse Colon = 3 and                         Left Colon = 3 (entire mucosa seen well with no                         residual staining, small fragments of stool or opaque                         liquid). The total BBPS score equals 9. The ileocecal                         valve, appendiceal orifice, and rectum were                         photographed. Findings:      The perianal and digital rectal examinations were normal. Pertinent       negatives include normal sphincter tone and no palpable rectal lesions.      The terminal ileum appeared normal.      A diminutive polyp was found in the sigmoid colon. The polyp was       sessile. The polyp was removed with a jumbo cold forceps. Resection and       retrieval were complete. Estimated blood loss: none.      Many medium-mouthed diverticula were found in the entire colon.      Non-bleeding external hemorrhoids were found during retroflexion. The       hemorrhoids were large. Impression:            - The examined portion of the ileum was normal.                        - One diminutive polyp in the sigmoid colon, removed  with a jumbo cold forceps. Resected and retrieved.                        - Diverticulosis in the entire examined colon.                        - Non-bleeding external hemorrhoids. Recommendation:        - Discharge patient to home (with escort).                        - Resume previous diet today.                        - Continue present medications.                        - Await pathology results.                        - Repeat colonoscopy in 5-10 years for surveillance                         based on pathology results. Procedure Code(s):     --- Professional ---                        [phone], Colonoscopy, flexible; with biopsy, single or                         multiple Diagnosis Code(s):     --- Professional ---                        Z86.010, Personal history of colonic  polyps                        K64.4, Residual hemorrhoidal skin tags                        D12.5, Benign neoplasm of sigmoid colon                        K57.30, Diverticulosis of large intestine without                         perforation or abscess without bleeding CPT copyright 2022 American Medical Association. All rights reserved. The codes documented in this report are preliminary and upon coder review may  be revised to meet current compliance requirements. Dr. Corinn Brooklyn Corinn Jess Brooklyn MD, MD 03/11/2024 8:12:38 AM This report has been signed electronically. Number of Addenda: 0 Note Initiated On: 03/11/2024 7:49 AM Scope Withdrawal Time: 0 hours 9 minutes 2 seconds  Total Procedure Duration: 0 hours 15 minutes 9 seconds  Estimated Blood Loss:  Estimated blood loss: none.      [facility]

## 2024-03-11 NOTE — H&P (Signed)
 Marcia JONELLE Brooklyn, MD [facility], DHIP [address]  [place], [place] [address]  Main: [phone] Fax:  [phone] Pager: [phone]   Primary Care Physician:  Epifanio Alm SQUIBB, MD Primary Gastroenterologist:  Dr. Corinn JONELLE Blake  Pre-Procedure History & Physical: HPI:  Marcia Blake is a 73 y.o. female is here for an colonoscopy.   Past Medical History:  Diagnosis Date   Cancer [facility]) [phone]   breast-rt.   Diabetes mellitus without complication (HCC)    History of kidney stones     Past Surgical History:  Procedure Laterality Date   ABDOMINAL HYSTERECTOMY     bilateral mastectomies Bilateral    BREAST SURGERY     CATARACT EXTRACTION Bilateral    COLONOSCOPY WITH PROPOFOL  N/A 02/04/2019   Procedure: COLONOSCOPY WITH PROPOFOL ;  Surgeon: Marcia Blake Skiff, MD;  Location: [facility] ENDOSCOPY;  Service: Gastroenterology;  Laterality: N/A;   duodenal carcinoid tumor resection     ESOPHAGOGASTRODUODENOSCOPY (EGD) WITH PROPOFOL  N/A 10/24/2022   Procedure: ESOPHAGOGASTRODUODENOSCOPY (EGD) WITH PROPOFOL ;  Surgeon: Marcia Blake Skiff, MD;  Location: [facility] ENDOSCOPY;  Service: Gastroenterology;  Laterality: N/A;   EYE SURGERY     feet surgery Bilateral    US  KOMAN RT BREAST ([facility] HX) Right     Prior to Admission medications   Medication Sig Start Date End Date Taking? Authorizing Provider  acetaminophen (TYLENOL) 500 MG tablet Take 500 mg by mouth every 6 (six) hours as needed.   Yes [provider]  aspirin 81 MG EC tablet Take 81 mg by mouth daily. 10/20/19  Yes [provider]  atorvastatin (LIPITOR) 20 MG tablet Take 20 mg by mouth daily.   Yes [provider]  fluocinonide  (LIDEX ) 0.05 % external solution Use daily as needed to affected areas on scalp, ears for dryness. 11/14/21  Yes Marcia Blake , MD  Multiple Vitamin (MULTIVITAMIN) capsule Take 1 capsule by mouth daily.   Yes [provider]  mupirocin   ointment (BACTROBAN ) 2 % Apply 1 Application topically 2 (two) times daily. Apply to affect area at neck and cover with bandage until healed. 11/14/21  Yes Marcia Blake , MD    Allergies as of 02/21/2024 - Review Complete 01/08/2024  Allergen Reaction Noted   Elemental sulfur Rash 04/13/2017   Penicillins Rash 04/13/2017    Family History  Problem Relation Age of Onset   Diabetes Sister     Social History   Socioeconomic History   Marital status: Divorced    Spouse name: Not on file   Number of children: Not on file   Years of education: Not on file   Highest education level: Not on file  Occupational History   Not on file  Tobacco Use   Smoking status: Former    Current packs/day: 0.00    Types: Cigarettes    Quit date: 1970    Years since quitting: 55.8    Passive exposure: Past   Smokeless tobacco: Never  Vaping Use   Vaping status: Never Used  Substance and Sexual Activity   Alcohol use: Yes    Comment: socially   Drug use: Never   Sexual activity: Yes    Birth control/protection: Post-menopausal  Other Topics Concern   Not on file  Social History Narrative   Not on file   Social Drivers of Health   Financial Resource Strain: Low Risk  (01/09/2024)   Received from [facility]   Overall Financial Resource Strain (CARDIA)    Difficulty  of Paying Living Expenses: Not hard at all  Food Insecurity: No Food Insecurity (01/09/2024)   Received from [facility]   Hunger Vital Sign    Within the past 12 months, you worried that your food would run out before you got the money to buy more.: Never true    Within the past 12 months, the food you bought just didn't last and you didn't have money to get more.: Never true  Transportation Needs: No Transportation Needs (01/09/2024)   Received from [facility] - Transportation    In the past 12 months, has lack of transportation kept you from medical appointments  or from getting medications?: No    Lack of Transportation (Non-Medical): No  Physical Activity: Not on file  Stress: Not on file  Social Connections: Unknown (09/24/2021)   Received from [facility]   Social Network    Social Network: Not on file  Intimate Partner Violence: Unknown (08/16/2021)   Received from [facility]   HITS    Physically Hurt: Not on file    Insult or Talk Down To: Not on file    Threaten Physical Harm: Not on file    Scream or Curse: Not on file    Review of Systems: See HPI, otherwise negative ROS  Physical Exam: BP 130/66   Pulse 83   Temp (!) 96.4 F (35.8 C) (Temporal)   Resp 18   Ht 5' 2.5 (1.588 m)   Wt 74.2 kg   SpO2 99%   BMI 29.45 kg/m  General:   Alert,  pleasant and cooperative in NAD Head:  Normocephalic and atraumatic. Neck:  Supple; no masses or thyromegaly. Lungs:  Clear throughout to auscultation.    Heart:  Regular rate and rhythm. Abdomen:  Soft, nontender and nondistended. Normal bowel sounds, without guarding, and without rebound.   Neurologic:  Alert and  oriented x4;  grossly normal neurologically.  Impression/Plan: Marcia Blake is here for a colonoscopy to be performed for Personal history of adenomatous colon polyp   Risks, benefits, limitations, and alternatives regarding  colonoscopy have been reviewed with the patient.  Questions have been answered.  All parties agreeable.   Marcia Brooklyn, MD  03/11/2024, 7:39 AM

## 2024-03-11 NOTE — Anesthesia Postprocedure Evaluation (Signed)
 Anesthesia Post Note  Patient: Marcia Blake  Procedure(s) Performed: COLONOSCOPY  Patient location during evaluation: PACU Anesthesia Type: General Level of consciousness: awake and awake and alert Pain management: satisfactory to patient Vital Signs Assessment: post-procedure vital signs reviewed and stable Respiratory status: spontaneous breathing Cardiovascular status: stable Anesthetic complications: no   No notable events documented.   Last Vitals:  Vitals:   03/11/24 0821 03/11/24 0830  BP: 128/70 136/68  Pulse: 82 80  Resp: 17 11  Temp:    SpO2: 100% 100%    Last Pain:  Vitals:   03/11/24 0830  TempSrc:   PainSc: 0-No pain                 VAN STAVEREN,Saje Gallop

## 2024-03-11 NOTE — Transfer of Care (Signed)
 Immediate Anesthesia Transfer of Care Note  Patient: Marcia Blake  Procedure(s) Performed: COLONOSCOPY  Patient Location: Endoscopy Unit  Anesthesia Type:General  Level of Consciousness: drowsy and patient cooperative  Airway & Oxygen Therapy: Patient Spontanous Breathing and Patient connected to face mask oxygen  Post-op Assessment: Report given to RN and Post -op Vital signs reviewed and stable  Post vital signs: Reviewed and stable  Last Vitals:  Vitals Value Taken Time  BP 146/67 03/11/24 08:12  Temp 35.7 C 03/11/24 08:12  Pulse 65 03/11/24 08:12  Resp 17 03/11/24 08:12  SpO2 100 % 03/11/24 08:12    Last Pain:  Vitals:   03/11/24 0812  TempSrc: Tympanic  PainSc: Asleep         Complications: No notable events documented.

## 2024-03-11 NOTE — Anesthesia Preprocedure Evaluation (Signed)
 Anesthesia Evaluation  Patient identified by MRN, date of birth, ID band Patient awake    Reviewed: Allergy & Precautions, NPO status , Patient's Chart, lab work & pertinent test results  Airway Mallampati: II  TM Distance: >3 FB Neck ROM: full    Dental  (+) Teeth Intact   Pulmonary neg pulmonary ROS, former smoker   Pulmonary exam normal breath sounds clear to auscultation       Cardiovascular Exercise Tolerance: Good negative cardio ROS Normal cardiovascular exam Rhythm:Regular Rate:Normal     Neuro/Psych negative neurological ROS  negative psych ROS   GI/Hepatic negative GI ROS, Neg liver ROS,GERD  Medicated,,  Endo/Other  negative endocrine ROSdiabetes, Type 2    Renal/GU negative Renal ROS  negative genitourinary   Musculoskeletal negative musculoskeletal ROS (+)    Abdominal Normal abdominal exam  (+)   Peds negative pediatric ROS (+)  Hematology negative hematology ROS (+)   Anesthesia Other Findings Past Medical History: [id number]: Cancer (HCC)     Comment:  breast-rt. No date: Diabetes mellitus without complication (HCC) No date: History of kidney stones  Past Surgical History: No date: ABDOMINAL HYSTERECTOMY No date: bilateral mastectomies; Bilateral No date: BREAST SURGERY No date: CATARACT EXTRACTION; Bilateral 02/04/2019: COLONOSCOPY WITH PROPOFOL ; N/A     Comment:  Procedure: COLONOSCOPY WITH PROPOFOL ;  Surgeon: Unk Corinn Skiff, MD;  Location: [facility] ENDOSCOPY;  Service:               Gastroenterology;  Laterality: N/A; No date: duodenal carcinoid tumor resection 10/24/2022: ESOPHAGOGASTRODUODENOSCOPY (EGD) WITH PROPOFOL ; N/A     Comment:  Procedure: ESOPHAGOGASTRODUODENOSCOPY (EGD) WITH               PROPOFOL ;  Surgeon: Unk Corinn Skiff, MD;  Location:               [facility] ENDOSCOPY;  Service: Gastroenterology;  Laterality:               N/A; No date: EYE SURGERY No  date: feet surgery; Bilateral No date: US  KOMAN RT BREAST ([facility] HX); Right  BMI    Body Mass Index: 29.45 kg/m      Reproductive/Obstetrics negative OB ROS                              Anesthesia Physical Anesthesia Plan  ASA: 2  Anesthesia Plan: General   Post-op Pain Management:    Induction: Intravenous  PONV Risk Score and Plan: Propofol  infusion and TIVA  Airway Management Planned: Natural Airway and Nasal Cannula  Additional Equipment:   Intra-op Plan:   Post-operative Plan:   Informed Consent: I have reviewed the patients History and Physical, chart, labs and discussed the procedure including the risks, benefits and alternatives for the proposed anesthesia with the patient or authorized representative who has indicated his/her understanding and acceptance.     Dental Advisory Given  Plan Discussed with: CRNA  Anesthesia Plan Comments:         Anesthesia Quick Evaluation

## 2024-03-14 NOTE — Therapy (Signed)
 OUTPATIENT PHYSICAL THERAPY THORACOLUMBAR TREATMENT   Patient Name: Marcia Blake MRN: [id number] DOB:[birth year], 73 y.o., female Today's Date: 03/15/2024  END OF SESSION:  PT End of Session - 03/15/24 0811     Visit Number 3    Number of Visits 17    Date for Recertification  04/20/24    PT Start Time 0815    PT Stop Time 0900    PT Time Calculation (min) 45 min    Activity Tolerance Patient tolerated treatment well    Behavior During Therapy [facility] for tasks assessed/performed           Past Medical History:  Diagnosis Date   Cancer (HCC) [id number]   breast-rt.   Diabetes mellitus without complication (HCC)    History of kidney stones    Past Surgical History:  Procedure Laterality Date   ABDOMINAL HYSTERECTOMY     bilateral mastectomies Bilateral    BREAST SURGERY     CATARACT EXTRACTION Bilateral    COLONOSCOPY N/A 03/11/2024   Procedure: COLONOSCOPY;  Surgeon: Unk Corinn Skiff, MD;  Location: [facility] ENDOSCOPY;  Service: Gastroenterology;  Laterality: N/A;   COLONOSCOPY WITH PROPOFOL  N/A 02/04/2019   Procedure: COLONOSCOPY WITH PROPOFOL ;  Surgeon: Unk Corinn Skiff, MD;  Location: [facility] ENDOSCOPY;  Service: Gastroenterology;  Laterality: N/A;   duodenal carcinoid tumor resection     ESOPHAGOGASTRODUODENOSCOPY (EGD) WITH PROPOFOL  N/A 10/24/2022   Procedure: ESOPHAGOGASTRODUODENOSCOPY (EGD) WITH PROPOFOL ;  Surgeon: Unk Corinn Skiff, MD;  Location: [facility] ENDOSCOPY;  Service: Gastroenterology;  Laterality: N/A;   EYE SURGERY     feet surgery Bilateral    POLYPECTOMY  03/11/2024   Procedure: POLYPECTOMY, INTESTINE;  Surgeon: Unk Corinn Skiff, MD;  Location: [facility] ENDOSCOPY;  Service: Gastroenterology;;   US  KOMAN RT BREAST [facility] HX) Right    Patient Active Problem List   Diagnosis Date Noted   RUQ abdominal pain 10/24/2022   Other gastritis with bleeding 10/24/2022   Duodenitis 10/24/2022   Dyspepsia 10/15/2019   Gastrointestinal symptoms 10/15/2019    History of adenomatous polyp of colon 10/15/2019   History of Helicobacter pylori infection 10/15/2019   Lactose intolerance 10/15/2019   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   Cervical radiculopathy 10/14/2019   Dizziness 10/14/2019   Encounter for screening colonoscopy    Family history of colon cancer in mother    History of mastectomy 03/30/2017   History of genetic counseling 02/03/2017   History of right breast cancer 07/03/2016   GERD (gastroesophageal reflux disease) 05/02/2016   Hyperlipidemia, unspecified 05/02/2016   Malignant neoplasm of female breast (HCC) 05/02/2016   Right kidney stone 05/02/2016   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 Typically just beneath her breasts and all the way around. She states it's aggravated when she stands for long periods of time and tightness when she sits in certain positions.    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 No issues with her breasts and no lumps or lesions. 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 I reviewed good supporting chairs and will also refer her to PT. We will see her again in  a year or sooner if needed. She was also seen by orthopedic about R hip pain where she was diagnosed with R greater trochanteric bursitis with moderate pain to palpation along R greater trochanteric bursa.   She underwent bilateral mastectomy and breast reconstruction with latissimus flaps with implants 7 years ago.   PAIN:  Are you having pain? No - she reports it is mostly just tightness vs pain  PRECAUTIONS: None  RED FLAGS: None   WEIGHT BEARING RESTRICTIONS: No  FALLS:  Has patient fallen in last 6 months? No  LIVING ENVIRONMENT: Lives with: lives with their daughter Lives in: House/apartment  OCCUPATION: retired   PLOF: Independent  PATIENT GOALS: to be able to see if there is something to relieve some of the tightness.    OBJECTIVE:  Note: Objective measures were completed at Evaluation unless otherwise noted.  DIAGNOSTIC FINDINGS:  N/A   PATIENT SURVEYS:  Modified Oswestry:  MODIFIED OSWESTRY DISABILITY SCALE  Date: 02/24/2024 Score  Pain intensity 0 = I can tolerate the pain I have without having to use pain medication.  2. Personal care (washing, dressing, etc.) 0 =  I can take care of myself normally without causing increased pain.  3. Lifting 1 = I can lift heavy weights, but it causes increased pain.  4. Walking 0 = Pain does not prevent me from walking any distance  5. Sitting 3 =  Pain prevents me from sitting more than  hour.  6. Standing 1 =  I can stand as long as I want but, it increases my pain.  7. Sleeping 0 = Pain does not prevent me from sleeping well.  8. Social Life 0 = My social life is normal and does not increase my pain.  9. Traveling 1 =  I can travel anywhere, but it increases my pain.  10. Employment/ Homemaking 1 = My normal homemaking/job activities increase my pain, but I can still perform all that is required of me  Total 7/50 = 14%   Interpretation of scores: Score Category Description  0-20% Minimal Disability The patient  can cope with most living activities. Usually no treatment is indicated apart from advice on lifting, sitting and exercise  21-40% Moderate Disability The patient experiences more pain and difficulty with sitting, lifting and standing. Travel and social life are more difficult and they may be disabled from work. Personal care, sexual activity and sleeping are not grossly affected, and the patient can usually be managed by conservative means  41-60% Severe Disability Pain remains the main problem in this group, but activities of daily living are affected. These patients require a detailed investigation  61-80% Crippled Back pain impinges on all aspects of the patient's life. Positive intervention is required  81-100% Bed-bound These patients are either bed-bound or exaggerating their symptoms  Bluford FORBES Zoe DELENA Karon DELENA, et al. Surgery versus conservative management of stable thoracolumbar fracture: the PRESTO feasibility RCT. [place] ([place]): [employer]; 2021 Nov. [facility] Technology Assessment, No. 25.62.) Appendix 3, Oswestry Disability Index category descriptors. Available from: Findjewelers.cz  Minimally Clinically Important Difference (MCID) = 12.8%  COGNITION: Overall cognitive status: Within functional limits for tasks assessed     SENSATION: WFL   POSTURE: rounded shoulders and forward head  PALPATION: Tightness notable to latissimus dorsi (R>L), thoracic paraspinals (R>L), and rhomboids  LUMBAR ROM:   AROM eval  Flexion WFL   Extension WFL   Right lateral flexion WFL   Left lateral flexion WFL   Right rotation WFL   Left rotation WFL    (Blank rows = not tested)  LOWER EXTREMITY ROM:     WFL   LOWER EXTREMITY MMT:    MMT Right eval Left eval  Hip flexion 4+ 4+  Hip extension    Hip abduction 4+ 4+  Hip adduction 4+ 4+  Hip internal rotation    Hip external rotation    Knee flexion 4+ 4+  Knee extension 4+ 4+  Ankle  dorsiflexion 4+ 4+  Ankle plantarflexion    Ankle inversion    Ankle eversion     (Blank rows = not tested)  FUNCTIONAL TESTS:  5 times sit to stand: to be completed visit #2   GAIT: Distance walked: 90'  Assistive device utilized: None Level of assistance: Complete Independence Comments: no significant gait deviations   TREATMENT DATE: 03/15/24                                                                                                                               Subjective: Patient reported that she went to a colonoscopy on Thursday. She reports that there were large hemorrhoids. She reports that hip pain is generally in the anteromedial side of her R hip. No additional questions or concerns.   5TSTS: 14.8s  -  Norms (70-79 years: 11.6  3.4 seconds (female), 13.0  4.8 seconds (female) )   The Five Times Sit to Stand Test measures one aspect of transfer skill. The test provides a method to quantify functional lower extremity strength and/or identify movement strategies a patient uses to complete transitional movements.   Therapeutic Exercise:   Supine OH reach with dowel to increase shoulder ROM and reduce tightness   1 x 10 - Yard Stick   2 x 10 - Weighted Dowel   Supine Bridges with Resistance   2 x 10 - Green TB around knees   1 x 10 - with Blue TB pallof    Supine 90/90 with TA activation  1 x 10 - Alternate LE marches   - minor hip   L Sidelying Clamshell   2 x 12 - Blue TB around knee   Standing Wall Angels  1 x 15 - VC to retract scapula   Reviewed Doorway Pec Stretch with pt return demo:   30s/bout x 3 in order to improve ROM and tissue extensibility   Therapeutic Activity( with intent to improve transferring, standing):  NuStep L5-1 x 5 min x LE (Seat 6) for LE endurance and strength for walking; PT manually adjusted resistance throughout bout.   Resisted Sit To Stand  1 x 10 - 3 Kg MB  2 x 10 - 3 Kg MB with RTB around knees   Standing Pallof  Press   3 x 10 - Blue TB   Lateral Stepping with resistance   2 x 12' ft  - Blue TB around thigh (Standing Rest break)   1 x 12' ft - Blue TB around ankles - minor pain in the R hip  4 x 12' ft - Blue TB around thighs    PATIENT EDUCATION:  Education details: HEP, POC, goals  Person educated: Patient Education method: Explanation, Demonstration, and Handouts Education comprehension: verbalized understanding and returned demonstration  HOME EXERCISE PROGRAM: Access Code: [email or web address] URL: [email or web address] Date: 02/24/2024 Prepared by: Maryanne Finder  Exercises - TL Sidebending Stretch - Single Arm Overhead  - 1-2 x daily - 5-7 x weekly - 3 reps - 30 seconds hold - Latissimus Dorsi Stretch at Wall  - 2-3 x daily - 5-7 x weekly - 3 reps - 30 seconds hold - Sidelying Thoracic Rotation with Open Book  - 2-3 x daily - 5-7 x weekly - 1-2 sets - 10 reps - 3-5 second hold - Supine Lower Trunk Rotation  - 2-3 x daily - 5-7 x weekly - 1-2 sets - 10 reps - 3-5 seconds hold  ASSESSMENT:  CLINICAL IMPRESSION: Continued PT POC focused on hip strengthening and decreasing tightness in latissiumus/thoracic group. PT focused on gluteal strengthening and core strengthening. Good tolerance to exercises without exacerbation to pain in R hip. 5TSTS tested today; patient's time is lower than age related norms indicating general LE weakness. Patient still limited with upward reaching and lifiting due to stiffness in scapular muscles; she also experiences LE weakness and gluteal pain. Patient will continue to benefit from skilled PT services to address listed impairments to improve quality of life and reduce discomfort.    OBJECTIVE IMPAIRMENTS: decreased activity tolerance, decreased ROM, decreased strength, impaired flexibility, postural dysfunction, and pain.   ACTIVITY LIMITATIONS: lifting, sitting, and sleeping  PARTICIPATION LIMITATIONS: cleaning, community activity, and yard  work  PERSONAL FACTORS: Age, Past/current experiences, and Time since onset of injury/illness/exacerbation are also affecting patient's functional outcome.   REHAB POTENTIAL: Good  CLINICAL DECISION MAKING: Stable/uncomplicated  EVALUATION COMPLEXITY: Low   GOALS: Goals reviewed with patient? Yes  SHORT TERM GOALS: Target date: 03/23/2024  Patient will be independent in HEP to improve strength/mobility for better functional independence with ADLs. Baseline: 02/24/24: HEP initiated  Goal status: INITIAL   LONG TERM GOALS: Target date: 04/20/2024  Patient will reduce modified Oswestry score by 4 points as to demonstrate improvement in ability to complete ADLs including improved sleeping tolerance, walking/sitting tolerance etc for better mobility with ADLs.  Baseline: 02/24/24: 7/50 = 14% Goal status: INITIAL  2.  Patient will report being able to sit >45 minutes without discomfort or tightness in mid back and under breasts to demonstrate improvement in muscle tightness and tolerance to sitting. Baseline: 02/24/24: unable to sit >30 minutes  Goal status: INITIAL  3.  Patient will improve five times sit to stand test time  by 3 seconds indicating an increased LE strength and improved balance. Baseline: 02/24/24:14.8s   Goal status: INITIAL  4.  Patient will increase BLE gross strength by 1/3 MMT grade as to improve functional strength for independent gait, increased standing tolerance and increased ADL ability. Baseline: 02/24/24: see above.  Goal status: INITIAL  PLAN:  PT FREQUENCY: 1-2x/week  PT DURATION: 8 weeks  PLANNED INTERVENTIONS: 97164- PT Re-evaluation, 97750- Physical Performance Testing, 97110-Therapeutic exercises, 97530- Therapeutic activity, [id number]- Neuromuscular re-education, 97535- Self Care, [id number]- Manual therapy, [id number]- Electrical stimulation (unattended), [phone]- Electrical stimulation (manual), [phone] (1-2 muscles), [id number] (3+ muscles)- Dry Needling,  Patient/Family education, Joint mobilization, Joint manipulation, Spinal manipulation, Spinal mobilization, Cryotherapy, and Moist heat.  PLAN FOR NEXT SESSION: thoracic/lumbar/lats stretch, core/BLE strengthening   Lonni Pall PT, DPT Physical Therapist- [place]  [facility] 03/15/2024, 9:01 AM

## 2024-03-15 ENCOUNTER — Ambulatory Visit: Attending: Plastic Surgery

## 2024-03-15 DIAGNOSIS — M6281 Muscle weakness (generalized): Secondary | ICD-10-CM | POA: Diagnosis present

## 2024-03-15 DIAGNOSIS — M546 Pain in thoracic spine: Secondary | ICD-10-CM | POA: Insufficient documentation

## 2024-03-16 ENCOUNTER — Ambulatory Visit
Admission: RE | Admit: 2024-03-16 | Discharge: 2024-03-16 | Disposition: A | Discharge status: Home / Self Care | Source: Ambulatory Visit | Attending: Gastroenterology | Admitting: Gastroenterology

## 2024-03-16 DIAGNOSIS — K8020 Calculus of gallbladder without cholecystitis without obstruction: Secondary | ICD-10-CM | POA: Insufficient documentation

## 2024-03-16 DIAGNOSIS — R140 Abdominal distension (gaseous): Secondary | ICD-10-CM | POA: Insufficient documentation

## 2024-03-16 DIAGNOSIS — R1013 Epigastric pain: Secondary | ICD-10-CM | POA: Diagnosis present

## 2024-03-16 MED ORDER — TECHNETIUM TC 99M MEBROFENIN IV KIT
4.9800 | PACK | Freq: Once | INTRAVENOUS | Status: AC | PRN
  Administered 2024-03-16: 4.98 via INTRAVENOUS

## 2024-03-17 ENCOUNTER — Ambulatory Visit

## 2024-03-17 LAB — SURGICAL PATHOLOGY

## 2024-03-19 ENCOUNTER — Ambulatory Visit: Payer: Self-pay | Admitting: Gastroenterology

## 2024-03-23 ENCOUNTER — Ambulatory Visit

## 2024-03-23 ENCOUNTER — Other Ambulatory Visit: Payer: Self-pay

## 2024-03-23 DIAGNOSIS — R1011 Right upper quadrant pain: Secondary | ICD-10-CM

## 2024-03-23 DIAGNOSIS — M6281 Muscle weakness (generalized): Secondary | ICD-10-CM

## 2024-03-23 DIAGNOSIS — D3A010 Benign carcinoid tumor of the duodenum: Secondary | ICD-10-CM

## 2024-03-23 DIAGNOSIS — M546 Pain in thoracic spine: Secondary | ICD-10-CM

## 2024-03-23 NOTE — Therapy (Signed)
 OUTPATIENT PHYSICAL THERAPY THORACOLUMBAR TREATMENT   Patient Name: Marcia Blake MRN: [id number] DOB:1950-12-05, 73 y.o., female Today's Date: 03/23/2024  END OF SESSION:  PT End of Session - 03/23/24 1122     Visit Number 4    Number of Visits 17    Date for Recertification  04/20/24    PT Start Time 1120    PT Stop Time 1200    PT Time Calculation (min) 40 min    Activity Tolerance Patient tolerated treatment well    Behavior During Therapy [facility] for tasks assessed/performed            Past Medical History:  Diagnosis Date   Cancer (HCC) [id number]   breast-rt.   Diabetes mellitus without complication (HCC)    History of kidney stones    Past Surgical History:  Procedure Laterality Date   ABDOMINAL HYSTERECTOMY     bilateral mastectomies Bilateral    BREAST SURGERY     CATARACT EXTRACTION Bilateral    COLONOSCOPY N/A 03/11/2024   Procedure: COLONOSCOPY;  Surgeon: Unk Corinn Skiff, MD;  Location: [facility] ENDOSCOPY;  Service: Gastroenterology;  Laterality: N/A;   COLONOSCOPY WITH PROPOFOL  N/A 02/04/2019   Procedure: COLONOSCOPY WITH PROPOFOL ;  Surgeon: Unk Corinn Skiff, MD;  Location: [facility] ENDOSCOPY;  Service: Gastroenterology;  Laterality: N/A;   duodenal carcinoid tumor resection     ESOPHAGOGASTRODUODENOSCOPY (EGD) WITH PROPOFOL  N/A 10/24/2022   Procedure: ESOPHAGOGASTRODUODENOSCOPY (EGD) WITH PROPOFOL ;  Surgeon: Unk Corinn Skiff, MD;  Location: [facility] ENDOSCOPY;  Service: Gastroenterology;  Laterality: N/A;   EYE SURGERY     feet surgery Bilateral    POLYPECTOMY  03/11/2024   Procedure: POLYPECTOMY, INTESTINE;  Surgeon: Unk Corinn Skiff, MD;  Location: [facility] ENDOSCOPY;  Service: Gastroenterology;;   US  KOMAN RT BREAST [facility] HX) Right    Patient Active Problem List   Diagnosis Date Noted   RUQ abdominal pain 10/24/2022   Other gastritis with bleeding 10/24/2022   Duodenitis 10/24/2022   Dyspepsia 10/15/2019   Gastrointestinal symptoms 10/15/2019    History of adenomatous polyp of colon 10/15/2019   History of Helicobacter pylori infection 10/15/2019   Lactose intolerance 10/15/2019   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   Cervical radiculopathy 10/14/2019   Dizziness 10/14/2019   Encounter for screening colonoscopy    Family history of colon cancer in mother    History of mastectomy 03/30/2017   History of genetic counseling 02/03/2017   History of right breast cancer 07/03/2016   GERD (gastroesophageal reflux disease) 05/02/2016   Hyperlipidemia, unspecified 05/02/2016   Malignant neoplasm of female breast (HCC) 05/02/2016   Right kidney stone 05/02/2016   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 Typically just beneath her breasts and all the way around. She states it's aggravated when she stands for long periods of time and tightness when she sits in certain positions.    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 No issues with her breasts and no lumps or lesions. 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 I reviewed good supporting chairs and will also refer her to PT. We will see her again in  a year or sooner if needed. She was also seen by orthopedic about R hip pain where she was diagnosed with R greater trochanteric bursitis with moderate pain to palpation along R greater trochanteric bursa.   She underwent bilateral mastectomy and breast reconstruction with latissimus flaps with implants 7 years ago.   PAIN:  Are you having pain? No - she reports it is mostly just tightness vs pain  PRECAUTIONS: None  RED FLAGS: None   WEIGHT BEARING RESTRICTIONS: No  FALLS:  Has patient fallen in last 6 months? No  LIVING ENVIRONMENT: Lives with: lives with their daughter Lives in: House/apartment  OCCUPATION: retired   PLOF: Independent  PATIENT GOALS: to be able to see if there is something to relieve some of the tightness.    OBJECTIVE:  Note: Objective measures were completed at Evaluation unless otherwise noted.  DIAGNOSTIC FINDINGS:  N/A   PATIENT SURVEYS:  Modified Oswestry:  MODIFIED OSWESTRY DISABILITY SCALE  Date: 02/24/2024 Score  Pain intensity 0 = I can tolerate the pain I have without having to use pain medication.  2. Personal care (washing, dressing, etc.) 0 =  I can take care of myself normally without causing increased pain.  3. Lifting 1 = I can lift heavy weights, but it causes increased pain.  4. Walking 0 = Pain does not prevent me from walking any distance  5. Sitting 3 =  Pain prevents me from sitting more than  hour.  6. Standing 1 =  I can stand as long as I want but, it increases my pain.  7. Sleeping 0 = Pain does not prevent me from sleeping well.  8. Social Life 0 = My social life is normal and does not increase my pain.  9. Traveling 1 =  I can travel anywhere, but it increases my pain.  10. Employment/ Homemaking 1 = My normal homemaking/job activities increase my pain, but I can still perform all that is required of me  Total 7/50 = 14%   Interpretation of scores: Score Category Description  0-20% Minimal Disability The patient  can cope with most living activities. Usually no treatment is indicated apart from advice on lifting, sitting and exercise  21-40% Moderate Disability The patient experiences more pain and difficulty with sitting, lifting and standing. Travel and social life are more difficult and they may be disabled from work. Personal care, sexual activity and sleeping are not grossly affected, and the patient can usually be managed by conservative means  41-60% Severe Disability Pain remains the main problem in this group, but activities of daily living are affected. These patients require a detailed investigation  61-80% Crippled Back pain impinges on all aspects of the patient's life. Positive intervention is required  81-100% Bed-bound These patients are either bed-bound or exaggerating their symptoms  Bluford FORBES Zoe DELENA Karon DELENA, et al. Surgery versus conservative management of stable thoracolumbar fracture: the PRESTO feasibility RCT. [place] ([place]): [employer]; 2021 Nov. [facility] Technology Assessment, No. 25.62.) Appendix 3, Oswestry Disability Index category descriptors. Available from: Findjewelers.cz  Minimally Clinically Important Difference (MCID) = 12.8%  COGNITION: Overall cognitive status: Within functional limits for tasks assessed     SENSATION: WFL   POSTURE: rounded shoulders and forward head  PALPATION: Tightness notable to latissimus dorsi (R>L), thoracic paraspinals (R>L), and rhomboids  LUMBAR ROM:   AROM eval  Flexion WFL   Extension WFL   Right lateral flexion WFL   Left lateral flexion WFL   Right rotation WFL   Left rotation WFL    (Blank rows = not tested)  LOWER EXTREMITY ROM:     WFL   LOWER EXTREMITY MMT:    MMT Right eval Left eval  Hip flexion 4+ 4+  Hip extension    Hip abduction 4+ 4+  Hip adduction 4+ 4+  Hip internal rotation    Hip external rotation    Knee flexion 4+ 4+  Knee extension 4+ 4+  Ankle  dorsiflexion 4+ 4+  Ankle plantarflexion    Ankle inversion    Ankle eversion     (Blank rows = not tested)  FUNCTIONAL TESTS:  5 times sit to stand: to be completed visit #2   GAIT: Distance walked: 69'  Assistive device utilized: None Level of assistance: Complete Independence Comments: no significant gait deviations   TREATMENT DATE: 03/23/24                                                                                                                               Subjective: Patient reports improvements in LE strength and reduced stiffness in the UE. She states that she's experiencing a a small cold and still has some pain from her hemorrhoids . No questions or concerns.   Therapeutic Exercise:   Hooklying OH reach for core stabilization   1 x 10 - 2 Kg med Ball   2 x 10 - against red TB  Supine Bridges with Resistance   3 x 10 - PT resistance at the knee   Supine 90/90 with TA activation  1 x 16 - Alternate LE marches   2 x 16 - with Alt LE march and Pallof press with red TB  Reviewed Doorway Pec Stretch with pt return demo:   30s/bout x 3 in order to improve ROM and tissue extensibility   Therapeutic Activity( with intent to improve transferring, standing, walking capacity):  NuStep L5-1 x 5 min x LE (Seat 6) x > 80 for LE endurance and strength for walking; PT manually adjusted resistance throughout bout.   Resisted Sit To Stand  1 x 10 - Red TB around knees  2 x 10 - Red TB around knees with  Med ball OH reach (3 Kg MB)    Lateral Stepping with resistance   2 x 12' - Blue TB around thigh (Standing Rest break)   2 x 12' - Blue TB around thigh   2 x 12' - Blue TB around thigh   Partial Squats  3 x 10 - lateral weight shift to the R side, corrected with tactile cue  PATIENT EDUCATION:  Education details: Exercise Technique, HEP Person educated: Patient Education method: Explanation, Demonstration, and Handouts Education comprehension: verbalized  understanding and returned demonstration  HOME EXERCISE PROGRAM: Access Code: [email or web address] URL: [email or web address] Date: 03/23/2024 Prepared by: Lonni Gerik Coberly  Exercises - TL Sidebending Stretch - Single Arm Overhead  - 1-2 x daily - 5-7 x weekly - 3 reps - 30 seconds hold - Latissimus Dorsi Stretch at Wall  - 2-3 x daily - 5-7 x weekly - 3 reps - 30 seconds hold - Sidelying Thoracic Rotation with Open Book  - 2-3 x daily - 5-7 x weekly - 1-2 sets - 10 reps - 3-5 second hold - Supine Lower Trunk Rotation  - 2-3 x daily - 5-7 x weekly - 1-2 sets - 10 reps - 3-5 seconds hold - Supine Bridge with Resistance Band  - 1 x daily - 5-7 x weekly - 2-3 sets - 10 reps - Sit to Stand with Arms Crossed  - 1 x daily - 5-7 x weekly - 2-3 sets - 10 reps - Clamshell with Resistance  - 1 x daily - 5-7 x weekly - 2-3 sets - 10 reps - Side Stepping with Resistance at Thighs  - 1 x daily - 5-7 x weekly - 2-3 sets - 10-12 reps  Access Code: [email or web address] URL: [email or web address] Date: 02/24/2024 Prepared by: Maryanne Finder  Exercises - TL Sidebending Stretch - Single Arm Overhead  - 1-2 x daily - 5-7 x weekly - 3 reps - 30 seconds hold - Latissimus Dorsi Stretch at Wall  - 2-3 x daily - 5-7 x weekly - 3 reps - 30 seconds hold - Sidelying Thoracic Rotation with Open Book  - 2-3 x daily - 5-7 x weekly - 1-2 sets - 10 reps - 3-5 second hold - Supine Lower Trunk Rotation  - 2-3 x daily - 5-7 x weekly - 1-2 sets - 10 reps - 3-5 seconds hold  ASSESSMENT:  CLINICAL IMPRESSION: Continued PT POC focused on hip strengthening and decreasing tightness in latissiumus/thoracic group. Continued focus on gluteal strengthening with functional activities. Patient with good demonstration of sit to stand transfers without UE support. Multimodal cues for squatting technique in order to decrease R weight shift. Patient still limited with upward reaching and lifiting due to stiffness in scapular  muscles; she also experiences LE weakness and gluteal pain. Patient will continue to benefit from skilled PT services to address listed impairments to improve quality of life and reduce discomfort.    OBJECTIVE IMPAIRMENTS: decreased activity tolerance, decreased ROM, decreased strength, impaired flexibility, postural dysfunction, and pain.   ACTIVITY LIMITATIONS: lifting, sitting, and sleeping  PARTICIPATION LIMITATIONS: cleaning, community activity, and yard work  PERSONAL FACTORS: Age, Past/current experiences, and Time since onset of injury/illness/exacerbation are also affecting patient's functional outcome.   REHAB POTENTIAL: Good  CLINICAL DECISION MAKING: Stable/uncomplicated  EVALUATION COMPLEXITY: Low   GOALS: Goals reviewed with patient? Yes  SHORT TERM GOALS: Target date: 03/23/2024  Patient will be independent in HEP to improve strength/mobility for better functional independence with ADLs. Baseline: 02/24/24: HEP initiated  Goal status: INITIAL  LONG TERM GOALS: Target date: 04/20/2024  Patient will reduce modified Oswestry score by 4 points as to demonstrate improvement in ability to complete ADLs including improved sleeping tolerance, walking/sitting tolerance etc for better mobility with ADLs.  Baseline: 02/24/24: 7/50 = 14% Goal status: INITIAL  2.  Patient will report being able to sit >45 minutes without discomfort or tightness in mid back and under breasts to demonstrate improvement in muscle tightness and tolerance to sitting. Baseline: 02/24/24: unable to sit >30 minutes  Goal status: INITIAL  3.  Patient will improve five times sit to stand test time by 3 seconds indicating an increased LE strength and improved balance. Baseline: 02/24/24:14.8s   Goal status: INITIAL  4.  Patient will increase BLE gross strength by 1/3 MMT grade as to improve functional strength for independent gait, increased standing tolerance and increased ADL ability. Baseline:  02/24/24: see above.  Goal status: INITIAL  PLAN:  PT FREQUENCY: 1-2x/week  PT DURATION: 8 weeks  PLANNED INTERVENTIONS: 97164- PT Re-evaluation, 97750- Physical Performance Testing, 97110-Therapeutic exercises, 97530- Therapeutic activity, [id number]- Neuromuscular re-education, 97535- Self Care, [id number]- Manual therapy, [id number]- Electrical stimulation (unattended), [phone]- Electrical stimulation (manual), [id number] (1-2 muscles), 20561 (3+ muscles)- Dry Needling, Patient/Family education, Joint mobilization, Joint manipulation, Spinal manipulation, Spinal mobilization, Cryotherapy, and Moist heat.  PLAN FOR NEXT SESSION: thoracic/lumbar/lats stretch, core/BLE strengthening   Lonni Pall PT, DPT Physical Therapist- [facility]  [facility] 03/23/2024, 11:23 AM

## 2024-03-24 ENCOUNTER — Ambulatory Visit: Payer: Medicare Other | Admitting: Oncology

## 2024-03-24 ENCOUNTER — Other Ambulatory Visit: Payer: Medicare Other

## 2024-03-24 ENCOUNTER — Inpatient Hospital Stay: Payer: Medicare Other | Admitting: Oncology

## 2024-03-24 ENCOUNTER — Other Ambulatory Visit: Payer: Self-pay

## 2024-03-24 ENCOUNTER — Encounter: Payer: Self-pay | Admitting: Oncology

## 2024-03-24 ENCOUNTER — Inpatient Hospital Stay: Payer: Medicare Other | Attending: Oncology

## 2024-03-24 VITALS — BP 126/82 | HR 67 | Temp 98.3°F | Resp 17 | Ht 62.5 in | Wt 165.7 lb

## 2024-03-24 DIAGNOSIS — D3A092 Benign carcinoid tumor of the stomach: Secondary | ICD-10-CM

## 2024-03-24 DIAGNOSIS — Z87891 Personal history of nicotine dependence: Secondary | ICD-10-CM | POA: Insufficient documentation

## 2024-03-24 DIAGNOSIS — Z853 Personal history of malignant neoplasm of breast: Secondary | ICD-10-CM | POA: Diagnosis not present

## 2024-03-24 DIAGNOSIS — Z86012 Personal history of benign carcinoid tumor: Secondary | ICD-10-CM | POA: Insufficient documentation

## 2024-03-24 DIAGNOSIS — Z9013 Acquired absence of bilateral breasts and nipples: Secondary | ICD-10-CM | POA: Diagnosis not present

## 2024-03-24 DIAGNOSIS — D3A010 Benign carcinoid tumor of the duodenum: Secondary | ICD-10-CM

## 2024-03-24 LAB — CBC WITH DIFFERENTIAL (CANCER CENTER ONLY)
Abs Immature Granulocytes: 0.02 K/uL (ref 0.00–0.07)
Basophils Absolute: 0 K/uL (ref 0.0–0.1)
Basophils Relative: 1 %
Eosinophils Absolute: 0.2 K/uL (ref 0.0–0.5)
Eosinophils Relative: 2 %
HCT: 38.8 % (ref 36.0–46.0)
Hemoglobin: 12.4 g/dL (ref 12.0–15.0)
Immature Granulocytes: 0 %
Lymphocytes Relative: 29 %
Lymphs Abs: 2 K/uL (ref 0.7–4.0)
MCH: 29.2 pg (ref 26.0–34.0)
MCHC: 32 g/dL (ref 30.0–36.0)
MCV: 91.5 fL (ref 80.0–100.0)
Monocytes Absolute: 0.8 K/uL (ref 0.1–1.0)
Monocytes Relative: 11 %
Neutro Abs: 4.1 K/uL (ref 1.7–7.7)
Neutrophils Relative %: 57 %
Platelet Count: 259 K/uL (ref 150–400)
RBC: 4.24 MIL/uL (ref 3.87–5.11)
RDW: 12.3 % (ref 11.5–15.5)
WBC Count: 7.1 K/uL (ref 4.0–10.5)
nRBC: 0 % (ref 0.0–0.2)

## 2024-03-24 LAB — CMP (CANCER CENTER ONLY)
ALT: 17 U/L (ref 0–44)
AST: 22 U/L (ref 15–41)
Albumin: 4 g/dL (ref 3.5–5.0)
Alkaline Phosphatase: 79 U/L (ref 38–126)
Anion gap: 10 (ref 5–15)
BUN: 17 mg/dL (ref 8–23)
CO2: 27 mmol/L (ref 22–32)
Calcium: 9.4 mg/dL (ref 8.9–10.3)
Chloride: 98 mmol/L (ref 98–111)
Creatinine: 0.66 mg/dL (ref 0.44–1.00)
GFR, Estimated: >60 mL/min (ref >60)
Glucose, Bld: 86 mg/dL (ref 70–99)
Potassium: 3.9 mmol/L (ref 3.5–5.1)
Sodium: 135 mmol/L (ref 135–145)
Total Bilirubin: 0.6 mg/dL (ref 0.0–1.2)
Total Protein: 7.2 g/dL (ref 6.5–8.1)

## 2024-03-24 NOTE — Progress Notes (Signed)
 Just had colonoscopy and a nuclear test for gallbladder.  Fluctuates between constipation and diarrhea, depends on what she eats.  Sore throat comes and goes and pain in ear. Patient is going for physical therapy d/t issues from breast surgery she had, feels a light of tightness.  Has had problems with right hip also.

## 2024-03-24 NOTE — Progress Notes (Signed)
 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  This was followed by a right breast lumpectomy 6 weeks of radiation treatment and 5 years of tamoxifen.  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  She underwent bilateral mastectomy at that time.  Left breast mastectomy showed sclerosing fibroadenoma with calcifications 0.8 cm completely excised.  No evidence of malignancy.  Right breast mastectomy showed no residual lesion pTis NX.  She did not require adjuvant hormone therapy after bilateral mastectomy.   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  She has been getting chromogranin A levels monitored which have been chronically elevated at 123 and 151 in the past.  Colonoscopy was negative and endoscopy report showed mild inflammatory changes at anastomosis which was widely patent and performed back in 2015.      Interval history-bowel movements are presently normal.  She denies any abdominal pain or distention.  Denies any new complaints at this time  ECOG PS- 1 Pain scale- 0   Review of systems- Review of Systems  Constitutional:  Negative for chills, fever, malaise/fatigue and weight loss.  HENT:  Negative  for congestion, ear discharge and nosebleeds.   Eyes:  Negative for blurred vision.  Respiratory:  Negative for cough, hemoptysis, sputum production, shortness of breath and wheezing.   Cardiovascular:  Negative for chest pain, palpitations, orthopnea and claudication.  Gastrointestinal:  Negative for abdominal pain, blood in stool, constipation, diarrhea, heartburn, melena, nausea and vomiting.  Genitourinary:  Negative for dysuria, flank pain, frequency, hematuria and urgency.  Musculoskeletal:  Negative for back pain, joint pain and myalgias.  Skin:  Negative for rash.  Neurological:  Negative for dizziness, tingling, focal weakness, seizures, weakness and headaches.  Endo/Heme/Allergies:  Does not bruise/bleed easily.  Psychiatric/Behavioral:  Negative for depression and suicidal ideas. The patient does not have insomnia.       Allergies  Allergen Reactions   Elemental Sulfur Rash   Penicillins Rash     Past Medical History:  Diagnosis Date   Cancer (HCC) [id number]   breast-rt.   Diabetes mellitus without complication (HCC)    History of kidney stones      Past Surgical History:  Procedure Laterality Date   ABDOMINAL HYSTERECTOMY     bilateral mastectomies Bilateral    BREAST SURGERY     CATARACT EXTRACTION Bilateral    COLONOSCOPY N/A 03/11/2024   Procedure: COLONOSCOPY;  Surgeon: Unk Corinn Skiff, MD;  Location: [facility] ENDOSCOPY;  Service: Gastroenterology;  Laterality: N/A;   COLONOSCOPY WITH PROPOFOL  N/A 02/04/2019   Procedure: COLONOSCOPY WITH PROPOFOL ;  Surgeon: Unk Corinn Skiff, MD;  Location: [facility] ENDOSCOPY;  Service: Gastroenterology;  Laterality: N/A;   duodenal carcinoid tumor resection     ESOPHAGOGASTRODUODENOSCOPY (EGD) WITH PROPOFOL  N/A 10/24/2022  Procedure: ESOPHAGOGASTRODUODENOSCOPY (EGD) WITH PROPOFOL ;  Surgeon: Unk Corinn Skiff, MD;  Location: [facility] ENDOSCOPY;  Service: Gastroenterology;  Laterality: N/A;   EYE SURGERY     feet surgery  Bilateral    POLYPECTOMY  03/11/2024   Procedure: POLYPECTOMY, INTESTINE;  Surgeon: Unk Corinn Skiff, MD;  Location: [facility] ENDOSCOPY;  Service: Gastroenterology;;   US  KOMAN RT BREAST ([facility] HX) Right     Social History   Socioeconomic History   Marital status: Divorced    Spouse name: Not on file   Number of children: Not on file   Years of education: Not on file   Highest education level: Not on file  Occupational History   Not on file  Tobacco Use   Smoking status: Former    Current packs/day: 0.00    Types: Cigarettes    Quit date: 1970    Years since quitting: 55.9    Passive exposure: Past   Smokeless tobacco: Never  Vaping Use   Vaping status: Never Used  Substance and Sexual Activity   Alcohol use: Yes    Comment: socially   Drug use: Never   Sexual activity: Yes    Birth control/protection: Post-menopausal  Other Topics Concern   Not on file  Social History Narrative   Not on file   Social Drivers of Health   Financial Resource Strain: Low Risk  (01/09/2024)   Received from [facility]   Overall Financial Resource Strain (CARDIA)    Difficulty of Paying Living Expenses: Not hard at all  Food Insecurity: No Food Insecurity (01/09/2024)   Received from [facility]   Hunger Vital Sign    Within the past 12 months, you worried that your food would run out before you got the money to buy more.: Never true    Within the past 12 months, the food you bought just didn't last and you didn't have money to get more.: Never true  Transportation Needs: No Transportation Needs (01/09/2024)   Received from [facility] - Transportation    In the past 12 months, has lack of transportation kept you from medical appointments or from getting medications?: No    Lack of Transportation (Non-Medical): No  Physical Activity: Not on file  Stress: Not on file  Social Connections: Unknown (09/24/2021)   Received from  [facility]   Social Network    Social Network: Not on file  Intimate Partner Violence: Unknown (08/16/2021)   Received from [facility]   HITS    Physically Hurt: Not on file    Insult or Talk Down To: Not on file    Threaten Physical Harm: Not on file    Scream or Curse: Not on file    Family History  Problem Relation Age of Onset   Diabetes Sister      Current Outpatient Medications:    acetaminophen (TYLENOL) 500 MG tablet, Take 500 mg by mouth every 6 (six) hours as needed., Disp: , Rfl:    aspirin 81 MG EC tablet, Take 81 mg by mouth daily., Disp: , Rfl:    atorvastatin (LIPITOR) 20 MG tablet, Take 20 mg by mouth daily. (Patient taking differently: Take 10 mg by mouth daily. Dose changed 10mg  daily), Disp: , Rfl:    Multiple Vitamin (MULTIVITAMIN) capsule, Take 1 capsule by mouth daily., Disp: , Rfl:    fluocinonide  (LIDEX ) 0.05 % external solution, Use daily as needed to affected areas on scalp, ears for  dryness. (Patient not taking: Reported on 03/24/2024), Disp: 180 mL, Rfl: 3   mupirocin  ointment (BACTROBAN ) 2 %, Apply 1 Application topically 2 (two) times daily. Apply to affect area at neck and cover with bandage until healed., Disp: 22 g, Rfl: 0  Physical exam:  Vitals:   03/24/24 1040  BP: 126/82  Pulse: 67  Resp: 17  Temp: 98.3 F (36.8 C)  TempSrc: Tympanic  SpO2: 99%  Weight: 165 lb 11.2 oz (75.2 kg)  Height: 5' 2.5 (1.588 m)   Physical Exam Cardiovascular:     Rate and Rhythm: Normal rate and regular rhythm.     Heart sounds: Normal heart sounds.  Pulmonary:     Effort: Pulmonary effort is normal.     Breath sounds: Normal breath sounds.  Skin:    General: Skin is warm and dry.  Neurological:     Mental Status: She is alert and oriented to person, place, and time.      I have personally reviewed labs listed below:    Latest Ref Rng & Units 03/24/2024    9:51 AM  CMP  Glucose 70 - 99 mg/dL 86   BUN 8 - 23 mg/dL 17   Creatinine 9.55 - 1.00  mg/dL 9.33   Sodium 864 - 854 mmol/L 135   Potassium 3.5 - 5.1 mmol/L 3.9   Chloride 98 - 111 mmol/L 98   CO2 22 - 32 mmol/L 27   Calcium 8.9 - 10.3 mg/dL 9.4   Total Protein 6.5 - 8.1 g/dL 7.2   Total Bilirubin 0.0 - 1.2 mg/dL 0.6   Alkaline Phos 38 - 126 U/L 79   AST 15 - 41 U/L 22   ALT 0 - 44 U/L 17       Latest Ref Rng & Units 03/24/2024    9:51 AM  CBC  WBC 4.0 - 10.5 K/uL 7.1   Hemoglobin 12.0 - 15.0 g/dL 87.5   Hematocrit 63.9 - 46.0 % 38.8   Platelets 150 - 400 K/uL 259    I have personally reviewed Radiology images listed below: No images are attached to the encounter.  NM Hepato W/EF Result Date: 03/17/2024 CLINICAL DATA:  Abdominal pain, bloating, cholelithiasis EXAM: NUCLEAR MEDICINE HEPATOBILIARY IMAGING WITH GALLBLADDER EF TECHNIQUE: Sequential images of the abdomen were obtained out to 60 minutes following intravenous administration of radiopharmaceutical. After oral ingestion of Ensure, gallbladder ejection fraction was determined. At 60 min, normal ejection fraction is greater than 33%. RADIOPHARMACEUTICALS:  4.98 mCi Tc-76m  Choletec IV COMPARISON:  November 21, 2023 FINDINGS: Prompt uptake and biliary excretion of activity by the liver is seen. Gallbladder activity is visualized, consistent with patency of cystic duct. Biliary activity passes into small bowel, consistent with patent common bile duct. Calculated gallbladder ejection fraction is 35%. (Normal gallbladder ejection fraction with Ensure is greater than 33% and less than 80%.) IMPRESSION: 1.  Patent cystic and common bile ducts. 2.  Normal gallbladder ejection fraction. Electronically Signed   By: Reyes Holder M.D.   On: 03/17/2024 07:20     Assessment and plan- Patient is a 73 y.o. female here for a routine follow-up of gastric carcinoid  Assessment and Plan    History of gastric carcinoid tumor Gastric carcinoid tumor diagnosed in 2011 with low recurrence risk after 15 years.  Routine scans not  recommended at this time. Recent CBC, CMP normal. Recent colonoscopy unremarkable. Last upper endoscopy in June of this year. -- Continue annual monitoring if reassuring.  Visit Diagnosis 1. Benign carcinoid tumor of stomach (HCC)      Dr. Annah Skene, MD, MPH [facility] [phone] 03/24/2024 2:12 PM

## 2024-03-25 ENCOUNTER — Ambulatory Visit

## 2024-03-25 DIAGNOSIS — M6281 Muscle weakness (generalized): Secondary | ICD-10-CM

## 2024-03-25 DIAGNOSIS — M546 Pain in thoracic spine: Secondary | ICD-10-CM

## 2024-03-25 LAB — CHROMOGRANIN A: Chromogranin A (ng/mL): 333.1 ng/mL — ABNORMAL HIGH (ref 0.0–101.8)

## 2024-03-25 NOTE — Therapy (Signed)
 OUTPATIENT PHYSICAL THERAPY THORACOLUMBAR TREATMENT   Patient Name: Marcia Blake MRN: [id number] DOB:1950/06/07, 73 y.o., female Today's Date: 03/25/2024  END OF SESSION:  PT End of Session - 03/25/24 1115     Visit Number 5    Number of Visits 17    Date for Recertification  04/20/24    PT Start Time 1115    PT Stop Time 1155    PT Time Calculation (min) 40 min    Activity Tolerance Patient tolerated treatment well    Behavior During Therapy [facility] for tasks assessed/performed             Past Medical History:  Diagnosis Date   Cancer (HCC) [id number]   breast-rt.   Diabetes mellitus without complication (HCC)    History of kidney stones    Past Surgical History:  Procedure Laterality Date   ABDOMINAL HYSTERECTOMY     bilateral mastectomies Bilateral    BREAST SURGERY     CATARACT EXTRACTION Bilateral    COLONOSCOPY N/A 03/11/2024   Procedure: COLONOSCOPY;  Surgeon: Unk Corinn Skiff, MD;  Location: [facility] ENDOSCOPY;  Service: Gastroenterology;  Laterality: N/A;   COLONOSCOPY WITH PROPOFOL  N/A 02/04/2019   Procedure: COLONOSCOPY WITH PROPOFOL ;  Surgeon: Unk Corinn Skiff, MD;  Location: [facility] ENDOSCOPY;  Service: Gastroenterology;  Laterality: N/A;   duodenal carcinoid tumor resection     ESOPHAGOGASTRODUODENOSCOPY (EGD) WITH PROPOFOL  N/A 10/24/2022   Procedure: ESOPHAGOGASTRODUODENOSCOPY (EGD) WITH PROPOFOL ;  Surgeon: Unk Corinn Skiff, MD;  Location: [facility] ENDOSCOPY;  Service: Gastroenterology;  Laterality: N/A;   EYE SURGERY     feet surgery Bilateral    POLYPECTOMY  03/11/2024   Procedure: POLYPECTOMY, INTESTINE;  Surgeon: Unk Corinn Skiff, MD;  Location: [facility] ENDOSCOPY;  Service: Gastroenterology;;   US  KOMAN RT BREAST [facility] HX) Right    Patient Active Problem List   Diagnosis Date Noted   RUQ abdominal pain 10/24/2022   Other gastritis with bleeding 10/24/2022   Duodenitis 10/24/2022   Dyspepsia 10/15/2019   Gastrointestinal symptoms 10/15/2019    History of adenomatous polyp of colon 10/15/2019   History of Helicobacter pylori infection 10/15/2019   Lactose intolerance 10/15/2019   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   Cervical radiculopathy 10/14/2019   Dizziness 10/14/2019   Encounter for screening colonoscopy    Family history of colon cancer in mother    History of mastectomy 03/30/2017   History of genetic counseling 02/03/2017   History of right breast cancer 07/03/2016   GERD (gastroesophageal reflux disease) 05/02/2016   Hyperlipidemia, unspecified 05/02/2016   Malignant neoplasm of female breast (HCC) 05/02/2016   Right kidney stone 05/02/2016   Malignant carcinoid tumor of stomach (HCC) 07/31/2009    PCP: Epifanio Alm SQUIBB, MD  REFERRING PROVIDER: Gerlean Bare, MD  REFERRING DIAG: [id number] (ICD-10-CM) - Hx of breast reconstruction  Rationale for Evaluation and Treatment: Rehabilitation  THERAPY DIAG:  Muscle weakness (generalized)  Pain in thoracic spine  ONSET DATE: ~1 year ago   SUBJECTIVE:  SUBJECTIVE STATEMENT: Patient reports over a year ago she feels as though everything is really tight. Typically just beneath her breasts and all the way around. She states it's aggravated when she stands for long periods of time and tightness when she sits in certain positions.    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 No issues with her breasts and no lumps or lesions. 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 I reviewed good supporting chairs and will also refer her to PT. We will see her again in  a year or sooner if needed. She was also seen by orthopedic about R hip pain where she was diagnosed with R greater trochanteric bursitis with moderate pain to palpation along R greater trochanteric bursa.   She underwent bilateral mastectomy and breast reconstruction with latissimus flaps with implants 7 years ago.   PAIN:  Are you having pain? No - she reports it is mostly just tightness vs pain  PRECAUTIONS: None  RED FLAGS: None   WEIGHT BEARING RESTRICTIONS: No  FALLS:  Has patient fallen in last 6 months? No  LIVING ENVIRONMENT: Lives with: lives with their daughter Lives in: House/apartment  OCCUPATION: retired   PLOF: Independent  PATIENT GOALS: to be able to see if there is something to relieve some of the tightness.    OBJECTIVE:  Note: Objective measures were completed at Evaluation unless otherwise noted.  DIAGNOSTIC FINDINGS:  N/A   PATIENT SURVEYS:  Modified Oswestry:  MODIFIED OSWESTRY DISABILITY SCALE  Date: 02/24/2024 Score  Pain intensity 0 = I can tolerate the pain I have without having to use pain medication.  2. Personal care (washing, dressing, etc.) 0 =  I can take care of myself normally without causing increased pain.  3. Lifting 1 = I can lift heavy weights, but it causes increased pain.  4. Walking 0 = Pain does not prevent me from walking any distance  5. Sitting 3 =  Pain prevents me from sitting more than  hour.  6. Standing 1 =  I can stand as long as I want but, it increases my pain.  7. Sleeping 0 = Pain does not prevent me from sleeping well.  8. Social Life 0 = My social life is normal and does not increase my pain.  9. Traveling 1 =  I can travel anywhere, but it increases my pain.  10. Employment/ Homemaking 1 = My normal homemaking/job activities increase my pain, but I can still perform all that is required of me  Total 7/50 = 14%   Interpretation of scores: Score Category Description  0-20% Minimal Disability The patient  can cope with most living activities. Usually no treatment is indicated apart from advice on lifting, sitting and exercise  21-40% Moderate Disability The patient experiences more pain and difficulty with sitting, lifting and standing. Travel and social life are more difficult and they may be disabled from work. Personal care, sexual activity and sleeping are not grossly affected, and the patient can usually be managed by conservative means  41-60% Severe Disability Pain remains the main problem in this group, but activities of daily living are affected. These patients require a detailed investigation  61-80% Crippled Back pain impinges on all aspects of the patient's life. Positive intervention is required  81-100% Bed-bound These patients are either bed-bound or exaggerating their symptoms  Bluford FORBES Zoe DELENA Karon DELENA, et al. Surgery versus conservative management of stable thoracolumbar fracture: the PRESTO feasibility RCT. [place] ([place]): [employer]; 2021 Nov. [facility] Technology Assessment, No. 25.62.) Appendix 3, Oswestry Disability Index category descriptors. Available from: Findjewelers.cz  Minimally Clinically Important Difference (MCID) = 12.8%  COGNITION: Overall cognitive status: Within functional limits for tasks assessed     SENSATION: WFL   POSTURE: rounded shoulders and forward head  PALPATION: Tightness notable to latissimus dorsi (R>L), thoracic paraspinals (R>L), and rhomboids  LUMBAR ROM:   AROM eval  Flexion WFL   Extension WFL   Right lateral flexion WFL   Left lateral flexion WFL   Right rotation WFL   Left rotation WFL    (Blank rows = not tested)  LOWER EXTREMITY ROM:     WFL   LOWER EXTREMITY MMT:    MMT Right eval Left eval  Hip flexion 4+ 4+  Hip extension    Hip abduction 4+ 4+  Hip adduction 4+ 4+  Hip internal rotation    Hip external rotation    Knee flexion 4+ 4+  Knee extension 4+ 4+  Ankle  dorsiflexion 4+ 4+  Ankle plantarflexion    Ankle inversion    Ankle eversion     (Blank rows = not tested)  FUNCTIONAL TESTS:  5 times sit to stand: to be completed visit #2   GAIT: Distance walked: 77'  Assistive device utilized: None Level of assistance: Complete Independence Comments: no significant gait deviations   TREATMENT DATE: 03/25/24                                                                                                                               Subjective: Patient reported that she feels a little sick, mild cold like symptoms (congestion). No questions or concerns.   Therapeutic Exercise:   Thoracic Open/Close Book for T spine opening   2 x 10 ea dir   Supine 90/90 OH reach for core stabilization   2 x 10 reps - 3 Kg Med Ball   Supine 90/90 with Alternating LE extension  3 x 10 reps   Resisted Supine Bridges   3 x 10 - Blue TB around knees and red TB pallof   Reviewed Doorway Pec Stretch with pt return demo:   30s/bout x 3 in order to improve ROM and tissue extensibility   Therapeutic Activity( with intent to improve transferring, standing, walking capacity):  NuStep L5-1 x 6 min x LE (Seat 6) x > 80 for LE endurance and strength for walking; PT manually adjusted resistance throughout bout.   Lateral Stepping with resistance   2 x 12' - Blue TB around thigh (Standing Rest break)   2 x 12' - Blue TB around thigh   TRX Squats   3 x 10 - Corrected lateral shift   Dumbbell Squat  1 x 10 - 9#   2 x 5 - 15#    PATIENT EDUCATION:  Education details: Exercise Technique, HEP Person educated: Patient Education method: Explanation, Demonstration, and Handouts Education comprehension: verbalized understanding and returned demonstration  HOME EXERCISE PROGRAM: Access Code: [email or web address] URL: [email or web address] Date: 03/23/2024 Prepared by: Lonni Eydan Chianese  Exercises - TL Sidebending Stretch - Single Arm Overhead  - 1-2 x daily  - 5-7 x weekly - 3 reps - 30 seconds hold - Latissimus Dorsi Stretch at Wall  - 2-3 x daily - 5-7 x weekly - 3 reps - 30 seconds hold - Sidelying Thoracic Rotation with Open Book  - 2-3 x daily - 5-7 x weekly - 1-2 sets - 10 reps - 3-5 second hold - Supine Lower Trunk Rotation  - 2-3 x daily - 5-7 x weekly - 1-2 sets - 10 reps - 3-5 seconds hold - Supine Bridge with Resistance Band  - 1 x daily - 5-7 x weekly - 2-3 sets - 10 reps - Sit to Stand with Arms Crossed  - 1 x daily - 5-7 x weekly - 2-3 sets - 10 reps - Clamshell with Resistance  - 1 x daily - 5-7 x weekly - 2-3 sets - 10 reps - Side Stepping with Resistance at Thighs  - 1 x daily - 5-7 x weekly - 2-3 sets - 10-12 reps  Access Code: [email or web address] URL: [email or web address] Date: 02/24/2024 Prepared by: Maryanne Finder  Exercises - TL Sidebending Stretch - Single Arm Overhead  - 1-2 x daily - 5-7 x weekly - 3 reps - 30 seconds hold - Latissimus Dorsi Stretch at Wall  - 2-3 x daily - 5-7 x weekly - 3 reps - 30 seconds hold - Sidelying Thoracic Rotation with Open Book  - 2-3 x daily - 5-7 x weekly - 1-2 sets - 10 reps - 3-5 second hold - Supine Lower Trunk Rotation  - 2-3 x daily - 5-7 x weekly - 1-2 sets - 10 reps - 3-5 seconds hold  ASSESSMENT:  CLINICAL IMPRESSION: Continued PT POC focused on hip strengthening and decreasing tightness in latissiumus/thoracic group. PT reviewed squat form and corrected lateral weight shift for equal distribution during movement. Good carry over from previous session; pt able to tolerate resisted squats with kettlebell. Pt also tolerated increased intensity with core stabilization exercises in supine; PT added resistance and limb movements. . Patient still limited with upward reaching and lifiting due to stiffness in scapular muscles; she also experiences LE weakness and gluteal pain. Patient will continue to benefit from skilled PT services to address listed impairments to improve quality of  life and reduce discomfort.    OBJECTIVE IMPAIRMENTS: decreased activity tolerance, decreased ROM, decreased strength, impaired flexibility, postural dysfunction, and pain.   ACTIVITY LIMITATIONS: lifting, sitting, and sleeping  PARTICIPATION LIMITATIONS: cleaning, community activity, and yard work  PERSONAL FACTORS: Age, Past/current experiences, and Time since onset of injury/illness/exacerbation are also affecting patient's functional outcome.   REHAB POTENTIAL: Good  CLINICAL DECISION MAKING: Stable/uncomplicated  EVALUATION COMPLEXITY: Low   GOALS: Goals reviewed with patient? Yes  SHORT TERM GOALS: Target date: 03/23/2024  Patient will be independent in HEP to improve strength/mobility for better functional independence with ADLs. Baseline: 02/24/24: HEP initiated  Goal status: INITIAL   LONG TERM GOALS: Target date: 04/20/2024  Patient will reduce modified Oswestry score by 4 points as to demonstrate improvement in ability to complete ADLs including improved sleeping tolerance, walking/sitting tolerance etc for better mobility with ADLs.  Baseline: 02/24/24: 7/50 = 14% Goal status: INITIAL  2.  Patient will report being able to sit >45 minutes without discomfort or tightness in mid back and under breasts to demonstrate improvement in muscle tightness and tolerance to sitting. Baseline: 02/24/24: unable to sit >30 minutes  Goal status: INITIAL  3.  Patient will improve five times sit to stand test time by 3 seconds indicating an increased LE strength and improved balance. Baseline: 02/24/24:14.8s   Goal status: INITIAL  4.  Patient will increase BLE gross strength by 1/3 MMT grade as to improve functional strength for independent gait, increased standing tolerance and increased ADL ability. Baseline: 02/24/24: see above.  Goal status: INITIAL  PLAN:  PT FREQUENCY: 1-2x/week  PT DURATION: 8 weeks  PLANNED INTERVENTIONS: 97164- PT Re-evaluation, 97750- Physical  Performance Testing, 97110-Therapeutic exercises, 97530- Therapeutic activity, [id number]- Neuromuscular re-education, 97535- Self Care, [id number]- Manual therapy, [id number]- Electrical stimulation (unattended), [phone]- Electrical stimulation (manual), [phone] (1-2 muscles), 20561 (3+ muscles)- Dry Needling, Patient/Family education, Joint mobilization, Joint manipulation, Spinal manipulation, Spinal mobilization, Cryotherapy, and Moist heat.  PLAN FOR NEXT SESSION: thoracic/lumbar/lats stretch, core/BLE strengthening   Lonni Pall PT, DPT Physical Therapist- House  [facility] 03/25/2024, 11:16 AM

## 2024-03-29 ENCOUNTER — Ambulatory Visit

## 2024-03-29 DIAGNOSIS — M6281 Muscle weakness (generalized): Secondary | ICD-10-CM | POA: Diagnosis not present

## 2024-03-29 DIAGNOSIS — M546 Pain in thoracic spine: Secondary | ICD-10-CM

## 2024-03-29 NOTE — Therapy (Signed)
 OUTPATIENT PHYSICAL THERAPY THORACOLUMBAR TREATMENT   Patient Name: Marcia Blake MRN: [id number] DOB:[birth year], 73 y.o., female Today's Date: 03/29/2024  END OF SESSION:  PT End of Session - 03/29/24 1034     Visit Number 6    Number of Visits 17    Date for Recertification  04/20/24    PT Start Time 1030    PT Stop Time 1112    PT Time Calculation (min) 42 min    Activity Tolerance Patient tolerated treatment well    Behavior During Therapy [facility] for tasks assessed/performed             Past Medical History:  Diagnosis Date   Cancer (HCC) [id number]   breast-rt.   Diabetes mellitus without complication (HCC)    History of kidney stones    Past Surgical History:  Procedure Laterality Date   ABDOMINAL HYSTERECTOMY     bilateral mastectomies Bilateral    BREAST SURGERY     CATARACT EXTRACTION Bilateral    COLONOSCOPY N/A 03/11/2024   Procedure: COLONOSCOPY;  Surgeon: Unk Corinn Skiff, MD;  Location: [facility] ENDOSCOPY;  Service: Gastroenterology;  Laterality: N/A;   COLONOSCOPY WITH PROPOFOL  N/A 02/04/2019   Procedure: COLONOSCOPY WITH PROPOFOL ;  Surgeon: Unk Corinn Skiff, MD;  Location: [facility] ENDOSCOPY;  Service: Gastroenterology;  Laterality: N/A;   duodenal carcinoid tumor resection     ESOPHAGOGASTRODUODENOSCOPY (EGD) WITH PROPOFOL  N/A 10/24/2022   Procedure: ESOPHAGOGASTRODUODENOSCOPY (EGD) WITH PROPOFOL ;  Surgeon: Unk Corinn Skiff, MD;  Location: [facility] ENDOSCOPY;  Service: Gastroenterology;  Laterality: N/A;   EYE SURGERY     feet surgery Bilateral    POLYPECTOMY  03/11/2024   Procedure: POLYPECTOMY, INTESTINE;  Surgeon: Unk Corinn Skiff, MD;  Location: [facility] ENDOSCOPY;  Service: Gastroenterology;;   US  KOMAN RT BREAST [facility] HX) Right    Patient Active Problem List   Diagnosis Date Noted   RUQ abdominal pain 10/24/2022   Other gastritis with bleeding 10/24/2022   Duodenitis 10/24/2022   Dyspepsia 10/15/2019   Gastrointestinal symptoms 10/15/2019    History of adenomatous polyp of colon 10/15/2019   History of Helicobacter pylori infection 10/15/2019   Lactose intolerance 10/15/2019   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   Cervical radiculopathy 10/14/2019   Dizziness 10/14/2019   Encounter for screening colonoscopy    Family history of colon cancer in mother    History of mastectomy 03/30/2017   History of genetic counseling 02/03/2017   History of right breast cancer 07/03/2016   GERD (gastroesophageal reflux disease) 05/02/2016   Hyperlipidemia, unspecified 05/02/2016   Malignant neoplasm of female breast (HCC) 05/02/2016   Right kidney stone 05/02/2016   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 Typically just beneath her breasts and all the way around. She states it's aggravated when she stands for long periods of time and tightness when she sits in certain positions.    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 No issues with her breasts and no lumps or lesions. 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 I reviewed good supporting chairs and will also refer her to PT. We will see her again in  a year or sooner if needed. She was also seen by orthopedic about R hip pain where she was diagnosed with R greater trochanteric bursitis with moderate pain to palpation along R greater trochanteric bursa.   She underwent bilateral mastectomy and breast reconstruction with latissimus flaps with implants 7 years ago.   PAIN:  Are you having pain? No - she reports it is mostly just tightness vs pain  PRECAUTIONS: None  RED FLAGS: None   WEIGHT BEARING RESTRICTIONS: No  FALLS:  Has patient fallen in last 6 months? No  LIVING ENVIRONMENT: Lives with: lives with their daughter Lives in: House/apartment  OCCUPATION: retired   PLOF: Independent  PATIENT GOALS: to be able to see if there is something to relieve some of the tightness.    OBJECTIVE:  Note: Objective measures were completed at Evaluation unless otherwise noted.  DIAGNOSTIC FINDINGS:  N/A   PATIENT SURVEYS:  Modified Oswestry:  MODIFIED OSWESTRY DISABILITY SCALE  Date: 02/24/2024 Score  Pain intensity 0 = I can tolerate the pain I have without having to use pain medication.  2. Personal care (washing, dressing, etc.) 0 =  I can take care of myself normally without causing increased pain.  3. Lifting 1 = I can lift heavy weights, but it causes increased pain.  4. Walking 0 = Pain does not prevent me from walking any distance  5. Sitting 3 =  Pain prevents me from sitting more than  hour.  6. Standing 1 =  I can stand as long as I want but, it increases my pain.  7. Sleeping 0 = Pain does not prevent me from sleeping well.  8. Social Life 0 = My social life is normal and does not increase my pain.  9. Traveling 1 =  I can travel anywhere, but it increases my pain.  10. Employment/ Homemaking 1 = My normal homemaking/job activities increase my pain, but I can still perform all that is required of me  Total 7/50 = 14%   Interpretation of scores: Score Category Description  0-20% Minimal Disability The patient  can cope with most living activities. Usually no treatment is indicated apart from advice on lifting, sitting and exercise  21-40% Moderate Disability The patient experiences more pain and difficulty with sitting, lifting and standing. Travel and social life are more difficult and they may be disabled from work. Personal care, sexual activity and sleeping are not grossly affected, and the patient can usually be managed by conservative means  41-60% Severe Disability Pain remains the main problem in this group, but activities of daily living are affected. These patients require a detailed investigation  61-80% Crippled Back pain impinges on all aspects of the patient's life. Positive intervention is required  81-100% Bed-bound These patients are either bed-bound or exaggerating their symptoms  Bluford FORBES Zoe DELENA Karon DELENA, et al. Surgery versus conservative management of stable thoracolumbar fracture: the PRESTO feasibility RCT. [place] ([place]): [employer]; 2021 Nov. [facility] Technology Assessment, No. 25.62.) Appendix 3, Oswestry Disability Index category descriptors. Available from: Findjewelers.cz  Minimally Clinically Important Difference (MCID) = 12.8%  COGNITION: Overall cognitive status: Within functional limits for tasks assessed     SENSATION: WFL   POSTURE: rounded shoulders and forward head  PALPATION: Tightness notable to latissimus dorsi (R>L), thoracic paraspinals (R>L), and rhomboids  LUMBAR ROM:   AROM eval  Flexion WFL   Extension WFL   Right lateral flexion WFL   Left lateral flexion WFL   Right rotation WFL   Left rotation WFL    (Blank rows = not tested)  LOWER EXTREMITY ROM:     WFL   LOWER EXTREMITY MMT:    MMT Right eval Left eval  Hip flexion 4+ 4+  Hip extension    Hip abduction 4+ 4+  Hip adduction 4+ 4+  Hip internal rotation    Hip external rotation    Knee flexion 4+ 4+  Knee extension 4+ 4+  Ankle  dorsiflexion 4+ 4+  Ankle plantarflexion    Ankle inversion    Ankle eversion     (Blank rows = not tested)  FUNCTIONAL TESTS:  5 times sit to stand: to be completed visit #2   GAIT: Distance walked: 22'  Assistive device utilized: None Level of assistance: Complete Independence Comments: no significant gait deviations   TREATMENT DATE: 03/29/24                                                                                                                               Subjective: Patient reports no lower back pain but did perform a lot of sitting during weekend conference. Couldn't perform her HEP due to busy schedule this past weekend. Cold like symptoms fully resolved. No questions or concerns.   Therapeutic Exercise:  Supine 90/90 with OH reach for core stabilization   3 x 10 reps - 3 Kg Med Ball   Supine 90/90 with Alternating Straight LE ext.   Supine 90/90 with Alternating LE marches  3 x 10 reps  - minor cramp in R hip flexor   Hip Flexor Stretch - Edge of Mat Table  R: 30s/bout x 3 bouts in order to improve tissue extensibility in hip flexor    Resisted Supine Bridges   3 x 10 - Blue TB around knees and red TB pallof      Therapeutic Activity( with intent to improve transferring, standing, walking capacity):  NuStep L5-1 x 6 min x LE (Seat 6) x > 80 for LE endurance and strength for walking; PT manually adjusted resistance throughout bout.   Resisted Sit To Stand   2 x 10 - 3 Kg Med ball   Standing Pallof Press  3 x 10 - 5#   Pallof Walkout   2 x 10 - Blue TB   Dumbbell Squat   2 x 10 - 15# DB     PATIENT EDUCATION:  Education details: Exercise Technique, HEP Person educated: Patient Education method: Explanation, Demonstration, and Handouts Education comprehension: verbalized understanding and returned demonstration  HOME EXERCISE PROGRAM: Access Code: [email or web address] URL: [email or web address] Date: 03/23/2024 Prepared by:  Lonni Jock Mahon  Exercises - TL Sidebending Stretch - Single Arm Overhead  - 1-2 x daily - 5-7 x weekly - 3 reps - 30 seconds hold - Latissimus Dorsi Stretch at Wall  - 2-3 x daily - 5-7 x weekly - 3 reps - 30 seconds hold - Sidelying Thoracic Rotation with Open Book  - 2-3 x daily - 5-7 x weekly - 1-2 sets - 10 reps - 3-5 second hold - Supine Lower Trunk Rotation  - 2-3 x daily - 5-7 x weekly - 1-2 sets - 10 reps - 3-5 seconds hold - Supine Bridge with Resistance Band  - 1 x daily - 5-7 x weekly - 2-3 sets - 10 reps - Sit to Stand with Arms Crossed  - 1 x daily - 5-7 x weekly - 2-3 sets - 10 reps - Clamshell with Resistance  - 1 x daily - 5-7 x weekly - 2-3 sets - 10 reps - Side Stepping with Resistance at Thighs  - 1 x daily - 5-7 x weekly - 2-3 sets - 10-12 reps  Access Code: [email or web address] URL: [email or web address] Date: 02/24/2024 Prepared by: Maryanne Finder  Exercises - TL Sidebending Stretch - Single Arm Overhead  - 1-2 x daily - 5-7 x weekly - 3 reps - 30 seconds hold - Latissimus Dorsi Stretch at Wall  - 2-3 x daily - 5-7 x weekly - 3 reps - 30 seconds hold - Sidelying Thoracic Rotation with Open Book  - 2-3 x daily - 5-7 x weekly - 1-2 sets - 10 reps - 3-5 second hold - Supine Lower Trunk Rotation  - 2-3 x daily - 5-7 x weekly - 1-2 sets - 10 reps - 3-5 seconds hold  ASSESSMENT:  CLINICAL IMPRESSION: Continued PT POC focused on hip strengthening and decreasing tightness in latissiumus/thoracic group. Patient agreeable to discharge in upcoming appointment due to her significant progress in pain. PT progressed core stabilization exercises to standing positions (pallof press). Great squat form noted today with increased resistance. Per 5TSTS result (see below at goals) patient has demosntrated increased LE strength. Updated HEP to include additional core stabilization exercises in standing. PT encouraged adherence to HEP in order to maintain progress. PT plans to review  progress towards PT goals and review comprehensive HEP in order for safe discharge.    OBJECTIVE IMPAIRMENTS: decreased activity tolerance, decreased ROM, decreased strength, impaired flexibility, postural dysfunction, and pain.   ACTIVITY LIMITATIONS: lifting, sitting, and sleeping  PARTICIPATION LIMITATIONS: cleaning, community activity, and yard work  PERSONAL FACTORS: Age, Past/current experiences, and Time since onset of injury/illness/exacerbation are also affecting patient's functional outcome.   REHAB POTENTIAL: Good  CLINICAL DECISION MAKING: Stable/uncomplicated  EVALUATION COMPLEXITY: Low   GOALS: Goals reviewed with patient? Yes  SHORT TERM GOALS: Target date: 03/23/2024  Patient will be independent in HEP to improve strength/mobility for better functional independence with ADLs. Baseline: 02/24/24: HEP initiated; 03/29/2024: 50% adherent   Goal status: Progressing    LONG TERM GOALS: Target date: 04/20/2024  Patient will reduce modified Oswestry score by 4 points as to demonstrate improvement in  ability to complete ADLs including improved sleeping tolerance, walking/sitting tolerance etc for better mobility with ADLs.  Baseline: 02/24/24: 7/50 = 14% Goal status: INITIAL  2.  Patient will report being able to sit >45 minutes without discomfort or tightness in mid back and under breasts to demonstrate improvement in muscle tightness and tolerance to sitting. Baseline: 02/24/24: unable to sit >30 minutes; 03/29/2024: > 45 min   Goal status: GOAL MET  3.  Patient will improve five times sit to stand test time by 3 seconds indicating an increased LE strength and improved balance. Baseline: 02/24/24:14.8s; 03/29/2024: 10.98s Goal status: Goal Met   4.  Patient will increase BLE gross strength by 1/3 MMT grade as to improve functional strength for independent gait, increased standing tolerance and increased ADL ability. Baseline: 02/24/24: see above.  Goal status:  INITIAL  PLAN:  PT FREQUENCY: 1-2x/week  PT DURATION: 8 weeks  PLANNED INTERVENTIONS: 97164- PT Re-evaluation, 97750- Physical Performance Testing, 97110-Therapeutic exercises, 97530- Therapeutic activity, [id number]- Neuromuscular re-education, 97535- Self Care, [id number]- Manual therapy, [id number]- Electrical stimulation (unattended), [phone]- Electrical stimulation (manual), [phone] (1-2 muscles), 20561 (3+ muscles)- Dry Needling, Patient/Family education, Joint mobilization, Joint manipulation, Spinal manipulation, Spinal mobilization, Cryotherapy, and Moist heat.  PLAN FOR NEXT SESSION: thoracic/lumbar/lats stretch, core/BLE strengthening   Lonni Pall PT, DPT Physical Therapist- [facility] 03/29/2024, 12:04 PM

## 2024-03-31 ENCOUNTER — Ambulatory Visit

## 2024-04-06 ENCOUNTER — Ambulatory Visit

## 2024-04-07 ENCOUNTER — Ambulatory Visit

## 2024-04-12 ENCOUNTER — Ambulatory Visit: Attending: Plastic Surgery

## 2024-04-12 DIAGNOSIS — M6281 Muscle weakness (generalized): Secondary | ICD-10-CM | POA: Diagnosis present

## 2024-04-12 DIAGNOSIS — M546 Pain in thoracic spine: Secondary | ICD-10-CM | POA: Diagnosis present

## 2024-04-12 NOTE — Therapy (Signed)
 OUTPATIENT PHYSICAL THERAPY THORACOLUMBAR TREATMENT/DISCHARGE SUMMARY   Patient Name: Marcia Blake MRN: [id number] DOB:[birth year], 73 y.o., female Today's Date: 04/12/2024  END OF SESSION:  PT End of Session - 04/12/24 1648     Visit Number 7    Number of Visits 17    Date for Recertification  04/20/24    PT Start Time 1647    PT Stop Time 1730    PT Time Calculation (min) 43 min    Activity Tolerance Patient tolerated treatment well    Behavior During Therapy [facility] for tasks assessed/performed              Past Medical History:  Diagnosis Date   Cancer (HCC) [id number]   breast-rt.   Diabetes mellitus without complication (HCC)    History of kidney stones    Past Surgical History:  Procedure Laterality Date   ABDOMINAL HYSTERECTOMY     bilateral mastectomies Bilateral    BREAST SURGERY     CATARACT EXTRACTION Bilateral    COLONOSCOPY N/A 03/11/2024   Procedure: COLONOSCOPY;  Surgeon: Unk Corinn Skiff, MD;  Location: [facility] ENDOSCOPY;  Service: Gastroenterology;  Laterality: N/A;   COLONOSCOPY WITH PROPOFOL  N/A 02/04/2019   Procedure: COLONOSCOPY WITH PROPOFOL ;  Surgeon: Unk Corinn Skiff, MD;  Location: [facility] ENDOSCOPY;  Service: Gastroenterology;  Laterality: N/A;   duodenal carcinoid tumor resection     ESOPHAGOGASTRODUODENOSCOPY (EGD) WITH PROPOFOL  N/A 10/24/2022   Procedure: ESOPHAGOGASTRODUODENOSCOPY (EGD) WITH PROPOFOL ;  Surgeon: Unk Corinn Skiff, MD;  Location: [facility] ENDOSCOPY;  Service: Gastroenterology;  Laterality: N/A;   EYE SURGERY     feet surgery Bilateral    POLYPECTOMY  03/11/2024   Procedure: POLYPECTOMY, INTESTINE;  Surgeon: Unk Corinn Skiff, MD;  Location: [facility] ENDOSCOPY;  Service: Gastroenterology;;   US  KOMAN RT BREAST [facility] HX) Right    Patient Active Problem List   Diagnosis Date Noted   RUQ abdominal pain 10/24/2022   Other gastritis with bleeding 10/24/2022   Duodenitis 10/24/2022   Dyspepsia 10/15/2019   Gastrointestinal  symptoms 10/15/2019   History of adenomatous polyp of colon 10/15/2019   History of Helicobacter pylori infection 10/15/2019   Lactose intolerance 10/15/2019   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   Cervical radiculopathy 10/14/2019   Dizziness 10/14/2019   Encounter for screening colonoscopy    Family history of colon cancer in mother    History of mastectomy 03/30/2017   History of genetic counseling 02/03/2017   History of right breast cancer 07/03/2016   GERD (gastroesophageal reflux disease) 05/02/2016   Hyperlipidemia, unspecified 05/02/2016   Malignant neoplasm of female breast (HCC) 05/02/2016   Right kidney stone 05/02/2016   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 Typically just beneath her breasts and all the way around. She states it's aggravated when she stands for long periods of time and tightness when she sits in certain positions.    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 No issues with her breasts and no lumps or lesions. 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 I reviewed good supporting chairs and will also refer her to PT. We  will see her again in a year or sooner if needed. She was also seen by orthopedic about R hip pain where she was diagnosed with R greater trochanteric bursitis with moderate pain to palpation along R greater trochanteric bursa.   She underwent bilateral mastectomy and breast reconstruction with latissimus flaps with implants 7 years ago.   PAIN:  Are you having pain? No - she reports it is mostly just tightness vs pain  PRECAUTIONS: None  RED FLAGS: None   WEIGHT BEARING RESTRICTIONS: No  FALLS:  Has patient fallen in last 6 months? No  LIVING ENVIRONMENT: Lives with: lives with their daughter Lives in: House/apartment  OCCUPATION: retired   PLOF: Independent  PATIENT GOALS: to be able to see if there is something to relieve some of the tightness.    OBJECTIVE:  Note: Objective measures were completed at Evaluation unless otherwise noted.  DIAGNOSTIC FINDINGS:  N/A   PATIENT SURVEYS:  Modified Oswestry:  MODIFIED OSWESTRY DISABILITY SCALE  Date: 02/24/2024 Score  Pain intensity 0 = I can tolerate the pain I have without having to use pain medication.  2. Personal care (washing, dressing, etc.) 0 =  I can take care of myself normally without causing increased pain.  3. Lifting 1 = I can lift heavy weights, but it causes increased pain.  4. Walking 0 = Pain does not prevent me from walking any distance  5. Sitting 3 =  Pain prevents me from sitting more than  hour.  6. Standing 1 =  I can stand as long as I want but, it increases my pain.  7. Sleeping 0 = Pain does not prevent me from sleeping well.  8. Social Life 0 = My social life is normal and does not increase my pain.  9. Traveling 1 =  I can travel anywhere, but it increases my pain.  10. Employment/ Homemaking 1 = My normal homemaking/job activities increase my pain, but I can still perform all that is required of me  Total 7/50 = 14%   Interpretation of scores: Score Category Description  0-20% Minimal  Disability The patient can cope with most living activities. Usually no treatment is indicated apart from advice on lifting, sitting and exercise  21-40% Moderate Disability The patient experiences more pain and difficulty with sitting, lifting and standing. Travel and social life are more difficult and they may be disabled from work. Personal care, sexual activity and sleeping are not grossly affected, and the patient can usually be managed by conservative means  41-60% Severe Disability Pain remains the main problem in this group, but activities of daily living are affected. These patients require a detailed investigation  61-80% Crippled Back pain impinges on all aspects of the patient's life. Positive intervention is required  81-100% Bed-bound These patients are either bed-bound or exaggerating their symptoms  Bluford FORBES Zoe Blake Marcia Blake, et al. Surgery versus conservative management of stable thoracolumbar fracture: the PRESTO feasibility RCT. [place] ([place]): [employer]; 2021 Nov. [facility] Technology Assessment, No. 25.62.) Appendix 3, Oswestry Disability Index category descriptors. Available from: Findjewelers.cz  Minimally Clinically Important Difference (MCID) = 12.8%  COGNITION: Overall cognitive status: Within functional limits for tasks assessed     SENSATION: WFL   POSTURE: rounded shoulders and forward head  PALPATION: Tightness notable to latissimus dorsi (R>L), thoracic paraspinals (R>L), and rhomboids  LUMBAR ROM:   AROM eval  Flexion WFL   Extension WFL   Right lateral flexion WFL   Left lateral flexion WFL   Right rotation WFL   Left rotation WFL    (Blank rows = not tested)  LOWER EXTREMITY ROM:     WFL   LOWER EXTREMITY MMT:    MMT Right eval Left eval  Hip flexion 4+ 4+  Hip extension    Hip abduction 4+ 4+  Hip adduction 4+ 4+  Hip internal rotation    Hip external rotation    Knee flexion 4+ 4+  Knee  extension 4+ 4+  Ankle dorsiflexion 4+ 4+  Ankle plantarflexion    Ankle inversion    Ankle eversion     (Blank rows = not tested)  FUNCTIONAL TESTS:  5 times sit to stand: to be completed visit #2   GAIT: Distance walked: 40'  Assistive device utilized: None Level of assistance: Complete Independence Comments: no significant gait deviations   TREATMENT DATE: 04/12/24                                                                                                                               Subjective: Patient reports that she recently had a death in the family; unable to complete entire HEP due to family commitments. Still experiencing some tightness in the lats  No questions or concerns.   MODIFIED OSWESTRY DISABILITY SCALE  Date: 04/12/2024 Score  Pain intensity 0 = I can tolerate the pain I have without having to use pain medication.  2. Personal care (washing, dressing, etc.) 0 =  I can take care of myself normally without causing increased pain.  3. Lifting 0 = I can lift heavy weights without increased pain.  4. Walking 0 = Pain does not prevent me from walking any distance  5. Sitting 2 =  Pain prevents me from sitting more than 1 hour.  6. Standing 0 =  I can stand as long as I want without increased pain.  7. Sleeping 0 = Pain does not prevent me from sleeping well.  8. Social Life 0 = My social life is normal and does not increase my pain.  9. Traveling 2 =  My pain restricts my travel over 2 hours.  10. Employment/ Homemaking 2 = I can perform most of my homemaking/job  duties, but pain prevents me from performing more physically stressful activities (eg, lifting, vacuuming).  Total 6/50   Interpretation of scores: Score Category Description  0-20% Minimal Disability The patient can cope with most living activities. Usually no treatment is indicated apart from advice on lifting, sitting and exercise  21-40% Moderate Disability The patient experiences more pain and  difficulty with sitting, lifting and standing. Travel and social life are more difficult and they may be disabled from work. Personal care, sexual activity and sleeping are not grossly affected, and the patient can usually be managed by conservative means  41-60% Severe Disability Pain remains the main problem in this group, but activities of daily living are affected. These patients require a detailed investigation  61-80% Crippled Back pain impinges on all aspects of the patient's life. Positive intervention is required  81-100% Bed-bound These patients are either bed-bound or exaggerating their symptoms  Bluford FORBES Zoe Blake Marcia Blake, et al. Surgery versus conservative management of stable thoracolumbar fracture: the PRESTO feasibility RCT. [place] ([place]): [employer]; 2021 Nov. [facility] Technology Assessment, No. 25.62.) Appendix 3, Oswestry Disability Index category descriptors. Available from: Findjewelers.cz  Minimally Clinically Important Difference (MCID) = 12.8%   Therapeutic Exercise:  Time spent reviewing HEP with return demonstration:   Supine Bridge with resistance  3 x 10 - Red TB around knees   Supine 90/90 with OH reach for core stabilization   3 x 10 reps - 3 Kg Med Ball   Sidelying Clamshell   3 x 10 - Red TB around thigh  Supine Thoracic Rotation with Open Book  L sidelying: 1 x 10   Therapeutic Activity( with intent to improve transferring, standing, walking capacity):  NuStep L5-1 x 6 min x LE (Seat 6) x > 80 for LE endurance and strength for walking; PT manually adjusted resistance throughout bout.   Resisted Side Stepping  4 x 12' - Red TB around thighs   4 x 12' - Red TB around thighs   Partial Squats  1 x 10  1 x 15 reps  PATIENT EDUCATION:  Education details: Exercise Technique, HEP Person educated: Patient Education method: Explanation, Demonstration, and Handouts Education comprehension: verbalized  understanding and returned demonstration  HOME EXERCISE PROGRAM: Access Code: [email or web address] URL: [email or web address] Date: 03/23/2024 Prepared by: Lonni Matilde Markie  Exercises - TL Sidebending Stretch - Single Arm Overhead  - 1-2 x daily - 5-7 x weekly - 3 reps - 30 seconds hold - Latissimus Dorsi Stretch at Wall  - 2-3 x daily - 5-7 x weekly - 3 reps - 30 seconds hold - Sidelying Thoracic Rotation with Open Book  - 2-3 x daily - 5-7 x weekly - 1-2 sets - 10 reps - 3-5 second hold - Supine Lower Trunk Rotation  - 2-3 x daily - 5-7 x weekly - 1-2 sets - 10 reps - 3-5 seconds hold - Supine Bridge with Resistance Band  - 1 x daily - 5-7 x weekly - 2-3 sets - 10 reps - Sit to Stand with Arms Crossed  - 1 x daily - 5-7 x weekly - 2-3 sets - 10 reps - Clamshell with Resistance  - 1 x daily - 5-7 x weekly - 2-3 sets - 10 reps - Side Stepping with Resistance at Thighs  - 1 x daily - 5-7 x weekly - 2-3 sets - 10-12 reps  Access Code: [email or web address] URL: [email or web address] Date: 02/24/2024 Prepared by: Maryanne Finder  Exercises - TL Sidebending Stretch - Single Arm  Overhead  - 1-2 x daily - 5-7 x weekly - 3 reps - 30 seconds hold - Latissimus Dorsi Stretch at Wall  - 2-3 x daily - 5-7 x weekly - 3 reps - 30 seconds hold - Sidelying Thoracic Rotation with Open Book  - 2-3 x daily - 5-7 x weekly - 1-2 sets - 10 reps - 3-5 second hold - Supine Lower Trunk Rotation  - 2-3 x daily - 5-7 x weekly - 1-2 sets - 10 reps - 3-5 seconds hold  ASSESSMENT:  CLINICAL IMPRESSION: Kennita Pavlovich is a 73 y.o. female seen for LE pain and thoracolumbar tightness. Pt reached end of POC and is agreeable to discharge to home with HEP. Pt has met all remaining goals and demonstrates improvements in LE strength, R hip/lower back pain and function per goals (see below). The patient has achieved independence with daily activities and is able to perform exercises with proper technique. They no longer  require skilled physical therapy interventions and are discharged with a home exercise program to maintain progress. No question at end of session. Follow-up with their primary care provider is recommended if any issues arise.  OBJECTIVE IMPAIRMENTS: decreased activity tolerance, decreased ROM, decreased strength, impaired flexibility, postural dysfunction, and pain.   ACTIVITY LIMITATIONS: lifting, sitting, and sleeping  PARTICIPATION LIMITATIONS: cleaning, community activity, and yard work  PERSONAL FACTORS: Age, Past/current experiences, and Time since onset of injury/illness/exacerbation are also affecting patient's functional outcome.   REHAB POTENTIAL: Good  CLINICAL DECISION MAKING: Stable/uncomplicated  EVALUATION COMPLEXITY: Low   GOALS: Goals reviewed with patient? Yes  SHORT TERM GOALS: Target date: 03/23/2024  Patient will be independent in HEP to improve strength/mobility for better functional independence with ADLs. Baseline: 02/24/24: HEP initiated; 03/29/2024: 50% adherent   Goal status: Partially Met     LONG TERM GOALS: Target date: 04/20/2024  Patient will reduce modified Oswestry score by 4 points as to demonstrate improvement in ability to complete ADLs including improved sleeping tolerance, walking/sitting tolerance etc for better mobility with ADLs.  Baseline: 02/24/24: 7/50 = 14%; 04/12/2024:6/50= 12%  Goal status: Partially Met   2.  Patient will report being able to sit >45 minutes without discomfort or tightness in mid back and under breasts to demonstrate improvement in muscle tightness and tolerance to sitting. Baseline: 02/24/24: unable to sit >30 minutes; 03/29/2024: > 45 min   Goal status: GOAL MET  3.  Patient will improve five times sit to stand test time by 3 seconds indicating an increased LE strength and improved balance. Baseline: 02/24/24:14.8s; 03/29/2024: 10.98s Goal status: Goal Met   4.  Patient will increase BLE gross strength by 1/3  MMT grade as to improve functional strength for independent gait, increased standing tolerance and increased ADL ability. Baseline: 02/24/24: see above; 04/12/2024:   MMT Out of 5 Right eval Left eval 04/12/24 R/L   Hip flexion 4+ 4+ 4+/4+  Hip extension     Hip abduction 4+ 4+ 5/5  Hip adduction 4+ 4+ 5/5  Hip internal rotation     Hip external rotation     Knee flexion 4+ 4+ 4+/5  Knee extension 4+ 4+ 5/5  Ankle dorsiflexion 4+ 4+ 5/5   Goal status: Partially Met  PLAN:  PT FREQUENCY: 1-2x/week  PT DURATION: 8 weeks  PLANNED INTERVENTIONS: 97164- PT Re-evaluation, 97750- Physical Performance Testing, 97110-Therapeutic exercises, 97530- Therapeutic activity, [id number]- Neuromuscular re-education, 97535- Self Care, [id number]- Manual therapy, [id number]- Electrical stimulation (unattended), 02967- Electrical stimulation (manual), 79439 (1-2 muscles),  79438 (3+ muscles)- Dry Needling, Patient/Family education, Joint mobilization, Joint manipulation, Spinal manipulation, Spinal mobilization, Cryotherapy, and Moist heat.  PLAN FOR NEXT SESSION: Discharge   Lonni Pall PT, DPT Physical Therapist- [facility] 04/12/2024, 4:49 PM

## 2024-10-18 ENCOUNTER — Ambulatory Visit: Admitting: Urology

## 2024-10-19 ENCOUNTER — Ambulatory Visit: Admitting: Urology

## 2025-01-13 ENCOUNTER — Ambulatory Visit: Admitting: Dermatology

## 2025-03-22 ENCOUNTER — Inpatient Hospital Stay

## 2025-03-22 ENCOUNTER — Inpatient Hospital Stay: Admitting: Oncology
